# Patient Record
Sex: Female | Born: 1965
Health system: Southern US, Community
[De-identification: ages and names within clinical notes are randomized; demographics above are authoritative.]

## PROBLEM LIST (undated history)

## (undated) DIAGNOSIS — Z789 Other specified health status: Secondary | ICD-10-CM

## (undated) HISTORY — PX: NO PAST SURGERIES: SHX2092

---

## 2019-06-04 DIAGNOSIS — J3489 Other specified disorders of nose and nasal sinuses: Secondary | ICD-10-CM | POA: Diagnosis not present

## 2019-06-04 DIAGNOSIS — R0602 Shortness of breath: Secondary | ICD-10-CM | POA: Diagnosis not present

## 2019-06-04 DIAGNOSIS — M25512 Pain in left shoulder: Secondary | ICD-10-CM | POA: Diagnosis not present

## 2019-06-04 DIAGNOSIS — R05 Cough: Secondary | ICD-10-CM | POA: Diagnosis not present

## 2019-06-08 ENCOUNTER — Other Ambulatory Visit: Payer: Self-pay

## 2019-06-10 ENCOUNTER — Ambulatory Visit (INDEPENDENT_AMBULATORY_CARE_PROVIDER_SITE_OTHER): Payer: BC Managed Care – PPO | Admitting: Family Medicine

## 2019-06-10 ENCOUNTER — Encounter: Payer: Self-pay | Admitting: Family Medicine

## 2019-06-10 VITALS — BP 130/74 | HR 85 | Temp 98.7°F | Ht 66.0 in | Wt 206.6 lb

## 2019-06-10 DIAGNOSIS — M25511 Pain in right shoulder: Secondary | ICD-10-CM

## 2019-06-10 DIAGNOSIS — Z13228 Encounter for screening for other metabolic disorders: Secondary | ICD-10-CM

## 2019-06-10 DIAGNOSIS — Z1159 Encounter for screening for other viral diseases: Secondary | ICD-10-CM

## 2019-06-10 DIAGNOSIS — Z23 Encounter for immunization: Secondary | ICD-10-CM

## 2019-06-10 DIAGNOSIS — R635 Abnormal weight gain: Secondary | ICD-10-CM | POA: Diagnosis not present

## 2019-06-10 DIAGNOSIS — F322 Major depressive disorder, single episode, severe without psychotic features: Secondary | ICD-10-CM

## 2019-06-10 DIAGNOSIS — E782 Mixed hyperlipidemia: Secondary | ICD-10-CM

## 2019-06-10 DIAGNOSIS — Z130 Encounter for screening for diseases of the blood and blood-forming organs and certain disorders involving the immune mechanism: Secondary | ICD-10-CM

## 2019-06-10 DIAGNOSIS — Z13220 Encounter for screening for lipoid disorders: Secondary | ICD-10-CM

## 2019-06-10 DIAGNOSIS — Z1211 Encounter for screening for malignant neoplasm of colon: Secondary | ICD-10-CM

## 2019-06-10 DIAGNOSIS — Z8349 Family history of other endocrine, nutritional and metabolic diseases: Secondary | ICD-10-CM

## 2019-06-10 HISTORY — DX: Mixed hyperlipidemia: E78.2

## 2019-06-10 MED ORDER — CITALOPRAM HYDROBROMIDE 20 MG PO TABS: 20.0000 mg | ORAL_TABLET | Freq: Every day | ORAL | 2 refills | Status: DC

## 2019-06-10 MED ORDER — SHINGRIX 50 MCG/0.5ML IM SUSR: 0.5000 mL | Freq: Once | INTRAMUSCULAR | 0 refills | Status: AC

## 2019-06-10 NOTE — Progress Notes (Signed)
New Patient Office Visit  Assessment & Plan:  1. Depression, major, single episode, severe ([place]) - Patient started on medication today for the first time to help with depression.  - Thyroid Panel With TSH - citalopram (CELEXA) 20 MG tablet; Take 1 tablet (20 mg total) by mouth daily.  Dispense: 30 tablet; Refill: 2  2. Acute pain of right shoulder - Patient to complete course of steroids.  Encouraged her to try taking ibuprofen 600 mg WITH Tylenol 500 mg every 6-8 hours as needed for pain, heating pad, and muscle rubs such as Biofreeze, [place] gel, and horse liniment.  Shoulder exercises provided for patient to complete daily; she did not want to go somewhere for physical therapy.   3. Weight gain - Thyroid Panel With TSH - Lipid panel  4. Family history of thyroid disease - Thyroid Panel With TSH  5. Screening for metabolic disorder - KDX83+JASN  6. Screening for lipid disorders - Lipid panel  7. Screening for deficiency anemia - CBC with Differential/Platelet  8. Encounter for hepatitis C screening test for low risk patient - Hepatitis C antibody  9. Colon cancer screening - Cologuard  10. Immunization due - SHINGRIX injection; Inject 0.5 mLs into the muscle once for 1 dose.  Dispense: 0.5 mL; Refill: 0   Follow-up: Return in about 6 weeks (around 07/22/2019) for depression, shoulder, pap.   Hendricks Limes, MSN, APRN, FNP-C [facility]  Subjective:  Patient ID: Jamie Jacobson, female    DOB: 03-Apr-1966  Age: 54 y.o. MRN: [id number]  Patient Care Team: Loman Brooklyn, FNP as PCP - General (Family Medicine)  CC:  Chief Complaint  Patient presents with  . New Patient (Initial Visit)    Urgent care follow up- shoulder pain  . Establish Care  . Depression    x 1 month     HPI Jamie Jacobson presents to establish care. Patient does not have a previous PCP from which she is transferring care as she has not been going to the doctor.    Depression: Patient has been experiencing depression recently. Reports she does not have a history of depression. She has never been on medications for depression.  Depression screen [facility] 2/9 06/10/2019  Decreased Interest 3  Down, Depressed, Hopeless 3  PHQ - 2 Score 6  Altered sleeping 3  Tired, decreased energy 3  Change in appetite 3  Feeling bad or failure about yourself  0  Trouble concentrating 3  Moving slowly or fidgety/restless 3  Suicidal thoughts 0  PHQ-9 Score 21  Difficult doing work/chores Somewhat difficult    Shoulder Pain: Patient complaints of left shoulder pain.  She did not experience any trauma or injury that she can recall. The pain is described as aching most of the time and burning with prolonged use.  The onset of the pain was gradual, starting several weeks ago.  The pain occurs continuously.  Location is lateral. No history of dislocation. Symptoms are aggravated by all activities. Symptoms are diminished by heat and Prednisone. Other medications tried include Ibuprofen 800 mg which was not effective.   Mild stiffness is reported. 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 She was seen at [facility] on 06/04/19 and she was advised to f/u with PCP due to shoulder pain; she was given a steroid taper at that time. She has on on her last day of Prednisone.    Patient also concerned about weight  gain and a significant family history of thyroid disease. She would like lab work today to see if she has thyroid disease as well.    Review of Systems  Constitutional: Negative for chills, fever, malaise/fatigue and weight loss.  HENT: Negative for congestion, ear discharge, ear pain, nosebleeds, sinus pain, sore throat and tinnitus.   Eyes: Negative for blurred vision, double vision, pain, discharge and redness.  Respiratory: Negative for cough, shortness of breath and wheezing.   Cardiovascular: Negative for chest pain, palpitations and leg  swelling.  Gastrointestinal: Negative for abdominal pain, constipation, diarrhea, heartburn, nausea and vomiting.  Genitourinary: Negative for dysuria, frequency and urgency.  Musculoskeletal: Positive for joint pain. Negative for myalgias.  Skin: Negative for rash.  Neurological: Negative for dizziness, seizures, weakness and headaches.  Psychiatric/Behavioral: Positive for depression. Negative for substance abuse and suicidal ideas. The patient is not nervous/anxious.     Current Outpatient Medications:  .  citalopram (CELEXA) 20 MG tablet, Take 1 tablet (20 mg total) by mouth daily., Disp: 30 tablet, Rfl: 2  No Known Allergies  History reviewed. No pertinent past medical history.  History reviewed. No pertinent surgical history.  Family History  Problem Relation Age of Onset  . Hypertension Mother   . Cancer Father        bone marrow  . Thyroid disease Sister   . Thyroid disease Brother   . Hypertension Maternal Grandmother   . Emphysema Paternal Grandmother   . Diabetes Paternal Grandfather   . Thyroid disease Brother     Social History   Socioeconomic History  . Marital status: Single    Spouse name: Not on file  . Number of children: Not on file  . Years of education: Not on file  . Highest education level: Not on file  Occupational History  . Not on file  Tobacco Use  . Smoking status: Former Smoker    Types: Cigarettes    Quit date: 06/09/2018    Years since quitting: 1.0  . Smokeless tobacco: Never Used  Substance and Sexual Activity  . Alcohol use: Yes    Comment: occ  . Drug use: Never  . Sexual activity: Not on file  Other Topics Concern  . Not on file  Social History Narrative  . Not on file   Social Determinants of Health   Financial Resource Strain:   . Difficulty of Paying Living Expenses: Not on file  Food Insecurity:   . Worried About Charity fundraiser in the Last Year: Not on file  . Ran Out of Food in the Last Year: Not on file    Transportation Needs:   . Lack of Transportation (Medical): Not on file  . Lack of Transportation (Non-Medical): Not on file  Physical Activity:   . Days of Exercise per Week: Not on file  . Minutes of Exercise per Session: Not on file  Stress:   . Feeling of Stress : Not on file  Social Connections:   . Frequency of Communication with Friends and Family: Not on file  . Frequency of Social Gatherings with Friends and Family: Not on file  . Attends Religious Services: Not on file  . Active Member of Clubs or Organizations: Not on file  . Attends Archivist Meetings: Not on file  . Marital Status: Not on file  Intimate Partner Violence:   . Fear of Current or Ex-Partner: Not on file  . Emotionally Abused: Not on file  . Physically Abused: Not  on file  . Sexually Abused: Not on file    Objective:   Today's Vitals: BP 130/74   Pulse 85   Temp 98.7 F (37.1 C) (Temporal)   Ht '5\' 6"'$  (1.676 m)   Wt 206 lb 9.6 oz (93.7 kg)   SpO2 96%   BMI 33.35 kg/m   Physical Exam Vitals reviewed.  Constitutional:      General: She is not in acute distress.    Appearance: Normal appearance. She is obese. She is not ill-appearing, toxic-appearing or diaphoretic.  HENT:     Head: Normocephalic and atraumatic.  Eyes:     General: No scleral icterus.       Right eye: No discharge.        Left eye: No discharge.     Conjunctiva/sclera: Conjunctivae normal.  Cardiovascular:     Rate and Rhythm: Normal rate and regular rhythm.     Heart sounds: Normal heart sounds. No murmur. No friction rub. No gallop.   Pulmonary:     Effort: Pulmonary effort is normal. No respiratory distress.     Breath sounds: Normal breath sounds. No stridor. No wheezing, rhonchi or rales.  Musculoskeletal:     Right shoulder: Tenderness (laterally) present. No swelling, deformity, effusion or laceration. Decreased range of motion (unable to abduct >90 degrees; decreased external rotation). Normal strength.  Normal pulse.     Cervical back: Normal range of motion.  Skin:    General: Skin is warm and dry.     Capillary Refill: Capillary refill takes less than 2 seconds.  Neurological:     General: No focal deficit present.     Mental Status: She is alert and oriented to person, place, and time. Mental status is at baseline.  Psychiatric:        Mood and Affect: Mood normal.        Behavior: Behavior normal.        Thought Content: Thought content normal.        Judgment: Judgment normal.

## 2019-06-10 NOTE — Patient Instructions (Signed)
Ibuprofen 600 mg WITH Tylenol 500 mg every 6-8 hours as needed for pain. Heat Muscle rubs - Biofreeze, [place] Gel, Horse Liniment   Shoulder Exercises Ask your health care provider which exercises are safe for you. Do exercises exactly as told by your health care provider and adjust them as directed. It is normal to feel mild stretching, pulling, tightness, or discomfort as you do these exercises. Stop right away if you feel sudden pain or your pain gets worse. Do not begin these exercises until told by your health care provider. Stretching exercises External rotation and abduction This exercise is sometimes called corner stretch. This exercise rotates your arm outward (external rotation) and moves your arm out from your body (abduction). 1. Stand in a doorway with one of your feet slightly in front of the other. This is called a staggered stance. If you cannot reach your forearms to the door frame, stand facing a corner of a room. 2. Choose one of the following positions as told by your health care provider: ? Place your hands and forearms on the door frame above your head. ? Place your hands and forearms on the door frame at the height of your head. ? Place your hands on the door frame at the height of your elbows. 3. Slowly move your weight onto your front foot until you feel a stretch across your chest and in the front of your shoulders. Keep your head and chest upright and keep your abdominal muscles tight. 4. Hold for __________ seconds. 5. To release the stretch, shift your weight to your back foot. Repeat __________ times. Complete this exercise __________ times a day. Extension, standing 1. Stand and hold a broomstick, a cane, or a similar object behind your back. ? Your hands should be a little wider than shoulder width apart. ? Your palms should face away from your back. 2. Keeping your elbows straight and your shoulder muscles relaxed, move the stick away from your body until you  feel a stretch in your shoulders (extension). ? Avoid shrugging your shoulders while you move the stick. Keep your shoulder blades tucked down toward the middle of your back. 3. Hold for __________ seconds. 4. Slowly return to the starting position. Repeat __________ times. Complete this exercise __________ times a day. Range-of-motion exercises Pendulum  1. Stand near a wall or a surface that you can hold onto for balance. 2. Bend at the waist and let your left / right arm hang straight down. Use your other arm to support you. Keep your back straight and do not lock your knees. 3. Relax your left / right arm and shoulder muscles, and move your hips and your trunk so your left / right arm swings freely. Your arm should swing because of the motion of your body, not because you are using your arm or shoulder muscles. 4. Keep moving your hips and trunk so your arm swings in the following directions, as told by your health care provider: ? Side to side. ? Forward and backward. ? In clockwise and counterclockwise circles. 5. Continue each motion for __________ seconds, or for as long as told by your health care provider. 6. Slowly return to the starting position. Repeat __________ times. Complete this exercise __________ times a day. Shoulder flexion, standing  1. Stand and hold a broomstick, a cane, or a similar object. Place your hands a little more than shoulder width apart on the object. Your left / right hand should be palm up, and your  other hand should be palm down. 2. Keep your elbow straight and your shoulder muscles relaxed. Push the stick up with your healthy arm to raise your left / right arm in front of your body, and then over your head until you feel a stretch in your shoulder (flexion). ? Avoid shrugging your shoulder while you raise your arm. Keep your shoulder blade tucked down toward the middle of your back. 3. Hold for __________ seconds. 4. Slowly return to the starting  position. Repeat __________ times. Complete this exercise __________ times a day. Shoulder abduction, standing 1. Stand and hold a broomstick, a cane, or a similar object. Place your hands a little more than shoulder width apart on the object. Your left / right hand should be palm up, and your other hand should be palm down. 2. Keep your elbow straight and your shoulder muscles relaxed. Push the object across your body toward your left / right side. Raise your left / right arm to the side of your body (abduction) until you feel a stretch in your shoulder. ? Do not raise your arm above shoulder height unless your health care provider tells you to do that. ? If directed, raise your arm over your head. ? Avoid shrugging your shoulder while you raise your arm. Keep your shoulder blade tucked down toward the middle of your back. 3. Hold for __________ seconds. 4. Slowly return to the starting position. Repeat __________ times. Complete this exercise __________ times a day. Internal rotation  1. Place your left / right hand behind your back, palm up. 2. Use your other hand to dangle an exercise band, a towel, or a similar object over your shoulder. Grasp the band with your left / right hand so you are holding on to both ends. 3. Gently pull up on the band until you feel a stretch in the front of your left / right shoulder. The movement of your arm toward the center of your body is called internal rotation. ? Avoid shrugging your shoulder while you raise your arm. Keep your shoulder blade tucked down toward the middle of your back. 4. Hold for __________ seconds. 5. Release the stretch by letting go of the band and lowering your hands. Repeat __________ times. Complete this exercise __________ times a day. Strengthening exercises External rotation  1. Sit in a stable chair without armrests. 2. Secure an exercise band to a stable object at elbow height on your left / right side. 3. Place a soft  object, such as a folded towel or a small pillow, between your left / right upper arm and your body to move your elbow about 4 inches (10 cm) away from your side. 4. Hold the end of the exercise band so it is tight and there is no slack. 5. Keeping your elbow pressed against the soft object, slowly move your forearm out, away from your abdomen (external rotation). Keep your body steady so only your forearm moves. 6. Hold for __________ seconds. 7. Slowly return to the starting position. Repeat __________ times. Complete this exercise __________ times a day. Shoulder abduction  1. Sit in a stable chair without armrests, or stand up. 2. Hold a __________ weight in your left / right hand, or hold an exercise band with both hands. 3. Start with your arms straight down and your left / right palm facing in, toward your body. 4. Slowly lift your left / right hand out to your side (abduction). Do not lift your hand above  shoulder height unless your health care provider tells you that this is safe. ? Keep your arms straight. ? Avoid shrugging your shoulder while you do this movement. Keep your shoulder blade tucked down toward the middle of your back. 5. Hold for __________ seconds. 6. Slowly lower your arm, and return to the starting position. Repeat __________ times. Complete this exercise __________ times a day. Shoulder extension 1. Sit in a stable chair without armrests, or stand up. 2. Secure an exercise band to a stable object in front of you so it is at shoulder height. 3. Hold one end of the exercise band in each hand. Your palms should face each other. 4. Straighten your elbows and lift your hands up to shoulder height. 5. Step back, away from the secured end of the exercise band, until the band is tight and there is no slack. 6. Squeeze your shoulder blades together as you pull your hands down to the sides of your thighs (extension). Stop when your hands are straight down by your sides. Do  not let your hands go behind your body. 7. Hold for __________ seconds. 8. Slowly return to the starting position. Repeat __________ times. Complete this exercise __________ times a day. Shoulder row 1. Sit in a stable chair without armrests, or stand up. 2. Secure an exercise band to a stable object in front of you so it is at waist height. 3. Hold one end of the exercise band in each hand. Position your palms so that your thumbs are facing the ceiling (neutral position). 4. Bend each of your elbows to a 90-degree angle (right angle) and keep your upper arms at your sides. 5. Step back until the band is tight and there is no slack. 6. Slowly pull your elbows back behind you. 7. Hold for __________ seconds. 8. Slowly return to the starting position. Repeat __________ times. Complete this exercise __________ times a day. Shoulder press-ups  1. Sit in a stable chair that has armrests. Sit upright, with your feet flat on the floor. 2. Put your hands on the armrests so your elbows are bent and your fingers are pointing forward. Your hands should be about even with the sides of your body. 3. Push down on the armrests and use your arms to lift yourself off the chair. Straighten your elbows and lift yourself up as much as you comfortably can. ? Move your shoulder blades down, and avoid letting your shoulders move up toward your ears. ? Keep your feet on the ground. As you get stronger, your feet should support less of your body weight as you lift yourself up. 4. Hold for __________ seconds. 5. Slowly lower yourself back into the chair. Repeat __________ times. Complete this exercise __________ times a day. Wall push-ups  1. Stand so you are facing a stable wall. Your feet should be about one arm-length away from the wall. 2. Lean forward and place your palms on the wall at shoulder height. 3. Keep your feet flat on the floor as you bend your elbows and lean forward toward the wall. 4. Hold for  __________ seconds. 5. Straighten your elbows to push yourself back to the starting position. Repeat __________ times. Complete this exercise __________ times a day. This information is not intended to replace advice given to you by your health care provider. Make sure you discuss any questions you have with your health care provider. Document Revised: 08/27/2018 Document Reviewed: 06/04/2018 Elsevier Patient Education  2020 [employer].

## 2019-06-11 LAB — CBC WITH DIFFERENTIAL/PLATELET
Basophils Absolute: 0.1 10*3/uL (ref 0.0–0.2)
Basos: 1 %
EOS (ABSOLUTE): 0.4 10*3/uL (ref 0.0–0.4)
Eos: 3 %
Hematocrit: 42 % (ref 34.0–46.6)
Hemoglobin: 13.7 g/dL (ref 11.1–15.9)
Immature Grans (Abs): 0.1 10*3/uL (ref 0.0–0.1)
Immature Granulocytes: 1 %
Lymphocytes Absolute: 5.2 10*3/uL — ABNORMAL HIGH (ref 0.7–3.1)
Lymphs: 39 %
MCH: 28.5 pg (ref 26.6–33.0)
MCHC: 32.6 g/dL (ref 31.5–35.7)
MCV: 87 fL (ref 79–97)
Monocytes Absolute: 1 10*3/uL — ABNORMAL HIGH (ref 0.1–0.9)
Monocytes: 8 %
Neutrophils Absolute: 6.5 10*3/uL (ref 1.4–7.0)
Neutrophils: 48 %
Platelets: 417 10*3/uL (ref 150–450)
RBC: 4.81 x10E6/uL (ref 3.77–5.28)
RDW: 12.8 % (ref 11.7–15.4)
WBC: 13.3 10*3/uL — ABNORMAL HIGH (ref 3.4–10.8)

## 2019-06-11 LAB — LIPID PANEL
Chol/HDL Ratio: 4.3 ratio (ref 0.0–4.4)
Cholesterol, Total: 230 mg/dL — ABNORMAL HIGH (ref 100–199)
HDL: 54 mg/dL (ref >39)
LDL Chol Calc (NIH): 140 mg/dL — ABNORMAL HIGH (ref 0–99)
Triglycerides: 204 mg/dL — ABNORMAL HIGH (ref 0–149)
VLDL Cholesterol Cal: 36 mg/dL (ref 5–40)

## 2019-06-11 LAB — HEPATITIS C ANTIBODY: Hep C Virus Ab: <0.1 s/co ratio (ref 0.0–0.9)

## 2019-06-11 LAB — CMP14+EGFR
ALT: 27 IU/L (ref 0–32)
AST: 28 IU/L (ref 0–40)
Albumin/Globulin Ratio: 1.5 (ref 1.2–2.2)
Albumin: 4.5 g/dL (ref 3.8–4.9)
Alkaline Phosphatase: 96 IU/L (ref 39–117)
BUN/Creatinine Ratio: 36 — ABNORMAL HIGH (ref 9–23)
BUN: 32 mg/dL — ABNORMAL HIGH (ref 6–24)
Bilirubin Total: 0.3 mg/dL (ref 0.0–1.2)
CO2: 28 mmol/L (ref 20–29)
Calcium: 9.5 mg/dL (ref 8.7–10.2)
Chloride: 98 mmol/L (ref 96–106)
Creatinine, Ser: 0.88 mg/dL (ref 0.57–1.00)
GFR calc Af Amer: 85 mL/min/{1.73_m2} (ref >59)
GFR calc non Af Amer: 74 mL/min/{1.73_m2} (ref >59)
Globulin, Total: 3 g/dL (ref 1.5–4.5)
Glucose: 88 mg/dL (ref 65–99)
Potassium: 4.8 mmol/L (ref 3.5–5.2)
Sodium: 139 mmol/L (ref 134–144)
Total Protein: 7.5 g/dL (ref 6.0–8.5)

## 2019-06-11 LAB — THYROID PANEL WITH TSH
Free Thyroxine Index: 1.8 (ref 1.2–4.9)
T3 Uptake Ratio: 25 % (ref 24–39)
T4, Total: 7.1 ug/dL (ref 4.5–12.0)
TSH: 2.05 u[IU]/mL (ref 0.450–4.500)

## 2019-06-12 ENCOUNTER — Encounter: Payer: Self-pay | Admitting: Family Medicine

## 2019-06-12 DIAGNOSIS — E782 Mixed hyperlipidemia: Secondary | ICD-10-CM | POA: Insufficient documentation

## 2019-07-19 DIAGNOSIS — Z1211 Encounter for screening for malignant neoplasm of colon: Secondary | ICD-10-CM | POA: Diagnosis not present

## 2019-07-22 ENCOUNTER — Encounter: Payer: BC Managed Care – PPO | Admitting: Family Medicine

## 2019-07-26 LAB — COLOGUARD: Cologuard: NEGATIVE

## 2019-08-01 ENCOUNTER — Telehealth: Payer: Self-pay | Admitting: Family Medicine

## 2019-08-01 NOTE — Telephone Encounter (Signed)
Aware.  Negative cologuard result.

## 2019-08-30 ENCOUNTER — Encounter: Payer: BC Managed Care – PPO | Admitting: Family Medicine

## 2019-09-08 ENCOUNTER — Encounter: Payer: Self-pay | Admitting: Family Medicine

## 2020-07-23 ENCOUNTER — Telehealth: Payer: Self-pay

## 2020-12-27 ENCOUNTER — Ambulatory Visit: Payer: Self-pay

## 2020-12-27 ENCOUNTER — Telehealth: Payer: Self-pay

## 2020-12-27 ENCOUNTER — Other Ambulatory Visit: Payer: Self-pay

## 2020-12-27 ENCOUNTER — Ambulatory Visit (INDEPENDENT_AMBULATORY_CARE_PROVIDER_SITE_OTHER): Payer: BC Managed Care – PPO | Admitting: Orthopaedic Surgery

## 2020-12-27 DIAGNOSIS — R200 Anesthesia of skin: Secondary | ICD-10-CM | POA: Diagnosis not present

## 2020-12-27 DIAGNOSIS — M5136 Other intervertebral disc degeneration, lumbar region: Secondary | ICD-10-CM | POA: Diagnosis not present

## 2020-12-27 DIAGNOSIS — M542 Cervicalgia: Secondary | ICD-10-CM

## 2020-12-27 DIAGNOSIS — M4712 Other spondylosis with myelopathy, cervical region: Secondary | ICD-10-CM | POA: Diagnosis not present

## 2020-12-27 NOTE — Telephone Encounter (Signed)
Patient seen today-referred for cervical and lumbar MRI's. Asking for valium for scans. Please advise.

## 2020-12-27 NOTE — Progress Notes (Signed)
Office Visit Note   Patient: Jamie Jacobson           Date of Birth: [birth year]           MRN: [id number] Visit Date: 12/27/2020              Requested by: Gwenlyn Fudge, FNP [address] [place],  [place] [address] PCP: Gwenlyn Fudge, FNP   Assessment & Plan: Visit Diagnoses:  1. Neck pain   2. Bilateral leg numbness   3. Other spondylosis with myelopathy, cervical region     Plan: Work slip given no work x2 weeks.  She is having trouble walking has sensory deficit motor deficit balance problems.  This is most likely cervical in origin but she may have a combination of cervical as well as lumbar stenosis.  Patient needs an MRI scan both cervical and lumbar spine and office follow-up after scans.  Patient has some claustrophobia Valium sent in.  Follow-Up Instructions: No follow-ups on file.   Orders:  Orders Placed This Encounter  Procedures   XR Cervical Spine 2 or 3 views   XR Lumbar Spine 2-3 Views   MR Cervical Spine w/o contrast   MR Lumbar Spine w/o contrast   No orders of the defined types were placed in this encounter.     Procedures: No procedures performed   Clinical Data: No additional findings.   Subjective: Chief Complaint  Patient presents with   Other     Acute onset of back pain x 1.5 months.  Now having bilateral leg numbness, neck pain, balance issues, incontinence    HPI 55 year old female works at [employer] saw the nurse at work was having problems with balance wide-based gait.  She states she feels like she is numb from the waist down and walks like a drunk.  She has trouble getting up and down stairs.  She has had balance issues but has not fallen.  Had problems holding her urine at times and has some problems with severe constipation for the last week.  No diabetes no hypertension non-smoker.  Patient has noted numbness and tingling in her fingers and not noticed weakness in her arms.  She states from the waist down she has  numbness.  Review of Systems patient has mixed hyperlipidemia take some Celexa otherwise has been active healthy without significant health problems.   Objective: Vital Signs: There were no vitals taken for this visit.  Physical Exam Constitutional:      Appearance: She is well-developed.  HENT:     Head: Normocephalic.     Right Ear: External ear normal.     Left Ear: External ear normal. There is no impacted cerumen.  Eyes:     Pupils: Pupils are equal, round, and reactive to light.  Neck:     Thyroid: No thyromegaly.     Trachea: No tracheal deviation.  Cardiovascular:     Rate and Rhythm: Normal rate.  Pulmonary:     Effort: Pulmonary effort is normal.  Abdominal:     Palpations: Abdomen is soft.  Musculoskeletal:     Cervical back: No rigidity.  Skin:    General: Skin is warm and dry.  Neurological:     Mental Status: She is alert and oriented to person, place, and time.  Psychiatric:        Behavior: Behavior normal.    Ortho Exam patient has minimal little pain with brachial plexus compression negative Spurling.  Negative Lhermitte.  Lower  extremity reflexes are 2+ and symmetrical she is able to heel and toe walk.  Negative straight leg raising 90 degrees.  She complains of symmetrical decreased sensation from the waist down all dermatomes.  Slight wide-based gait some decreased balance with turning changing directions.  Specialty Comments:  No specialty comments available.  Imaging: XR Cervical Spine 2 or 3 views  Result Date: 12/28/2020 AP lateral cervical spine x-ray showed multilevel spondylitic changes with narrowing at C3-4 C4-5 C5-6.  Spurring more prominent at C3-4 and C5-6.  Uncovertebral changes noted on AP x-ray. Impression: Multilevel cervical spondylosis.  Straightening of cervical spine.  More spurring and narrowing at C3-4 and C5-6.  XR Lumbar Spine 2-3 Views  Result Date: 12/28/2020 AP lateral lumbar spine x-rays are obtained and reviewed.  This  shows disc space narrowing at L4-5, anterior spurring at L3-4.  Normal lumbar curvature.  Normal SI joints. Impression: Changes consistent with lumbar disc degeneration without acute fracture.    PMFS History: Patient Active Problem List   Diagnosis Date Noted   Other spondylosis with myelopathy, cervical region 12/28/2020   Mixed hyperlipidemia    Past Medical History:  Diagnosis Date   Mixed hyperlipidemia 06/10/2019    Family History  Problem Relation Age of Onset   Hypertension Mother    Cancer Father        bone marrow   Thyroid disease Sister    Thyroid disease Brother    Hypertension Maternal Grandmother    Emphysema Paternal Grandmother    Diabetes Paternal Grandfather    Thyroid disease Brother     No past surgical history on file. Social History   Occupational History   Not on file  Tobacco Use   Smoking status: Former    Types: Cigarettes    Quit date: 06/09/2018    Years since quitting: 2.5   Smokeless tobacco: Never  Vaping Use   Vaping Use: Never used  Substance and Sexual Activity   Alcohol use: Yes    Comment: occ   Drug use: Never   Sexual activity: Not on file

## 2020-12-28 ENCOUNTER — Telehealth: Payer: Self-pay

## 2020-12-28 DIAGNOSIS — M4712 Other spondylosis with myelopathy, cervical region: Secondary | ICD-10-CM | POA: Insufficient documentation

## 2020-12-28 DIAGNOSIS — M5136 Other intervertebral disc degeneration, lumbar region: Secondary | ICD-10-CM | POA: Insufficient documentation

## 2020-12-28 MED ORDER — DIAZEPAM 5 MG PO TABS: ORAL_TABLET | ORAL | 0 refills | Status: DC

## 2020-12-28 NOTE — Addendum Note (Signed)
Addended byPrescott Parma on: 12/28/2020 01:43 PM   Modules accepted: Orders

## 2020-12-28 NOTE — Telephone Encounter (Signed)
I called to pharmacy. Also patient wanted work note to return to work. She stated her job advised her if she did not report to work on Sunday she would be fired. I have ok'd this by Dr Ophelia Charter. Note written.

## 2020-12-28 NOTE — Telephone Encounter (Signed)
error 

## 2021-01-01 ENCOUNTER — Other Ambulatory Visit: Payer: Self-pay

## 2021-01-01 ENCOUNTER — Telehealth: Payer: Self-pay | Admitting: Orthopaedic Surgery

## 2021-01-01 ENCOUNTER — Ambulatory Visit
Admission: RE | Admit: 2021-01-01 | Discharge: 2021-01-01 | Disposition: A | Discharge status: Home / Self Care | Payer: BC Managed Care – PPO | Source: Ambulatory Visit | Attending: Orthopaedic Surgery | Admitting: Orthopaedic Surgery

## 2021-01-01 ENCOUNTER — Telehealth: Payer: Self-pay | Admitting: Radiology

## 2021-01-01 DIAGNOSIS — M4802 Spinal stenosis, cervical region: Secondary | ICD-10-CM | POA: Diagnosis not present

## 2021-01-01 DIAGNOSIS — M542 Cervicalgia: Secondary | ICD-10-CM | POA: Diagnosis not present

## 2021-01-01 DIAGNOSIS — M48061 Spinal stenosis, lumbar region without neurogenic claudication: Secondary | ICD-10-CM | POA: Diagnosis not present

## 2021-01-01 DIAGNOSIS — R200 Anesthesia of skin: Secondary | ICD-10-CM

## 2021-01-01 IMAGING — MR MR CERVICAL SPINE W/O CM
4 of 5 series · 28 of 48 positions shown · non-contrast
Comparison: None.

CLINICAL DATA: Neck pain with bilateral shoulder and arm pain for 2
months

EXAM:
MRI CERVICAL SPINE WITHOUT CONTRAST
TECHNIQUE: Multiplanar, multisequence MR imaging of the cervical spine was
performed. No intravenous contrast was administered.

[Series 5: T2 · sagittal · 3.0mm · 0.55mm/px · 7 of 15 slices shown (1 of 2)]
[im 1/15]
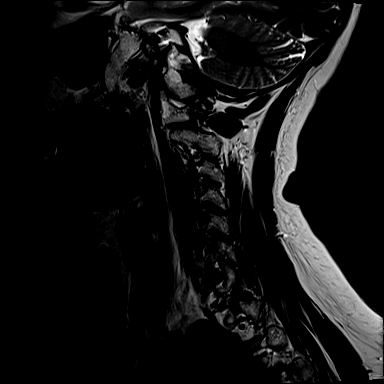
[im 3/15]
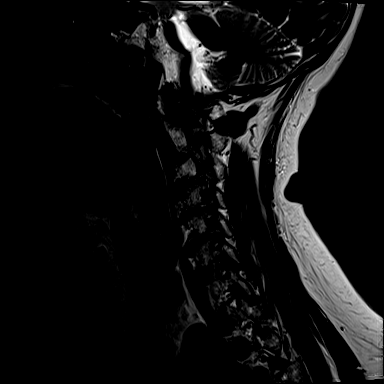
[im 5/15]
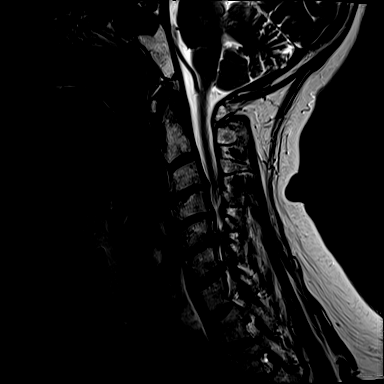
[im 8/15]
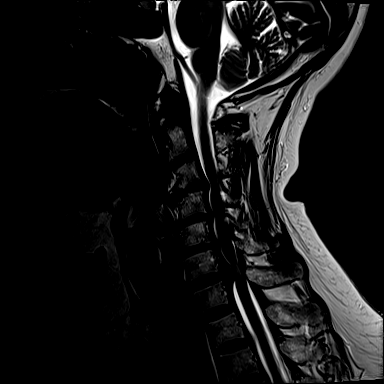
[im 10/15]
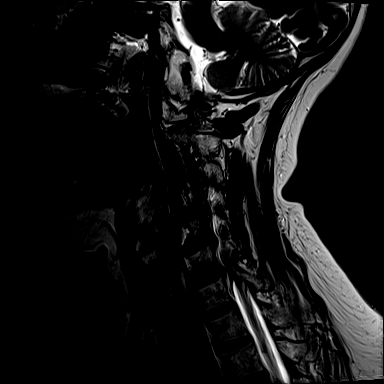
[im 12/15]
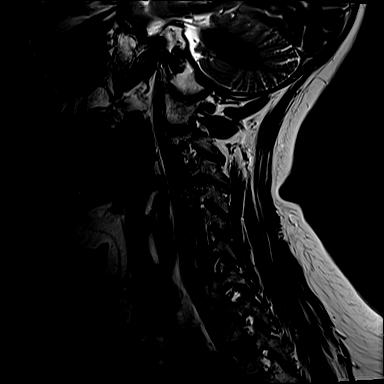
[im 15/15]
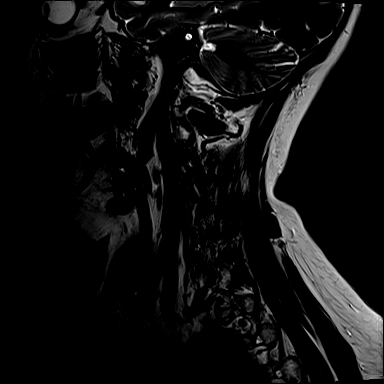

[Series 6: T1 · sagittal · 3.0mm · 0.66mm/px · 7 of 15 slices shown]
[im 1/15]
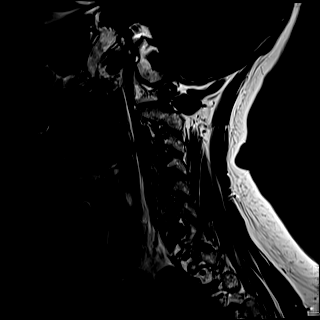
[im 3/15]
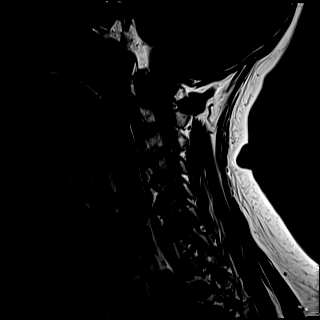
[im 5/15]
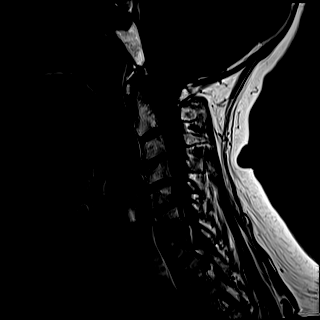
[im 8/15]
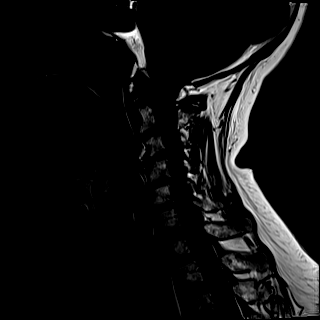
[im 10/15]
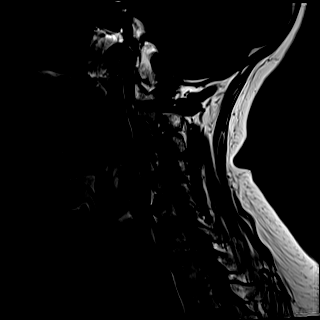
[im 12/15]
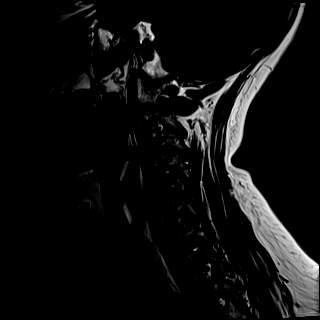
[im 15/15]
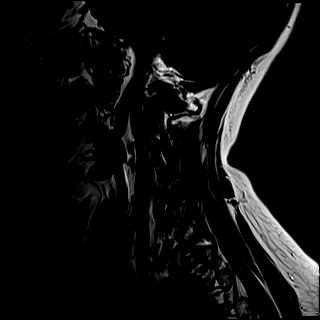

[Series 7: STIR · sagittal · 3.0mm · 0.33mm/px · 6 of 15 slices shown]
[im 1/15]
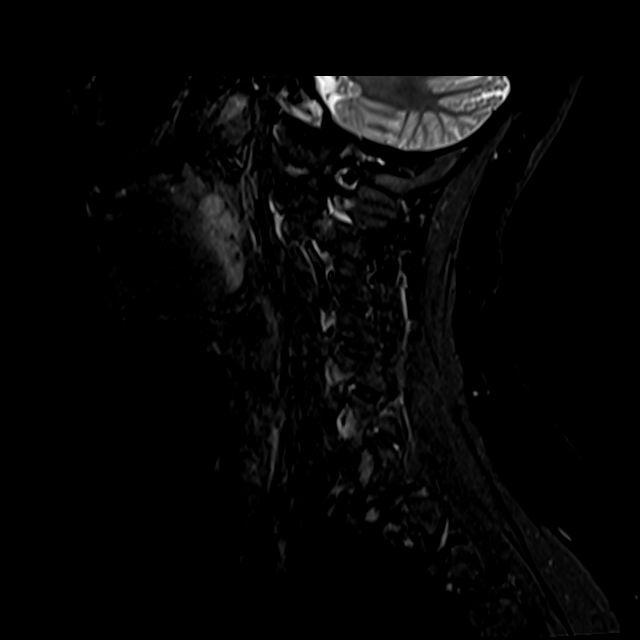
[im 3/15]
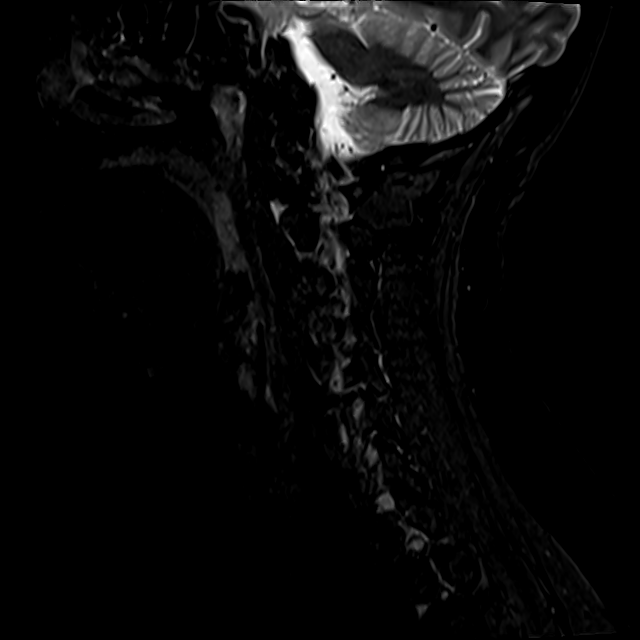
[im 6/15]
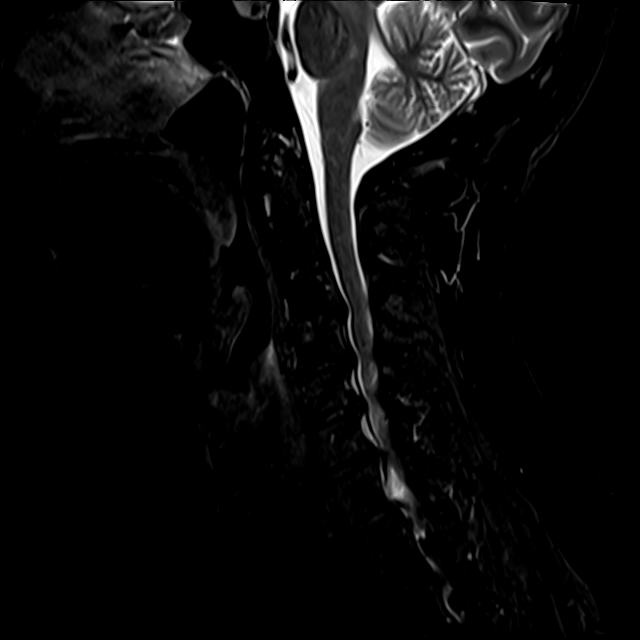
[im 9/15]
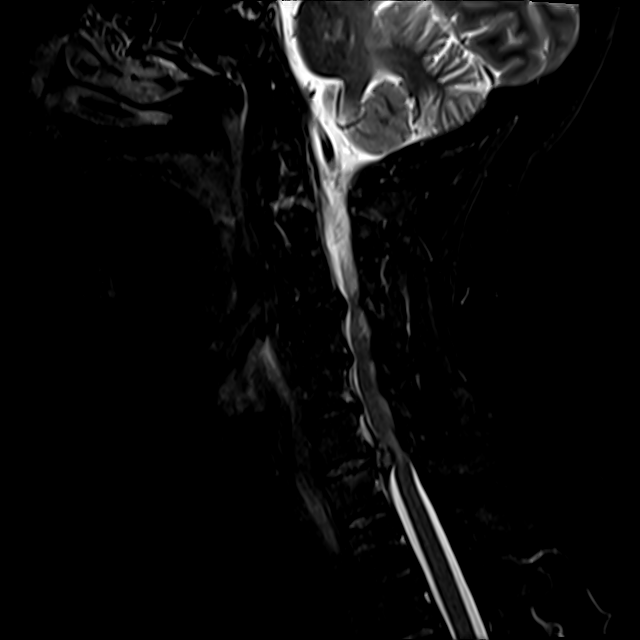
[im 12/15]
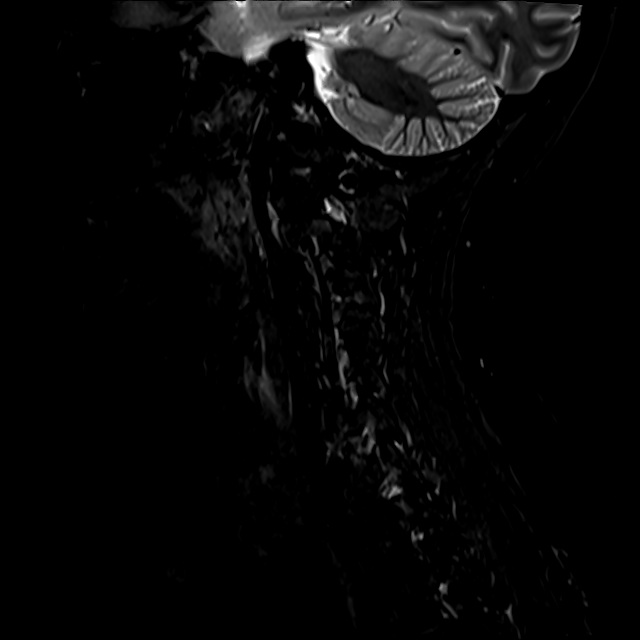
[im 15/15]
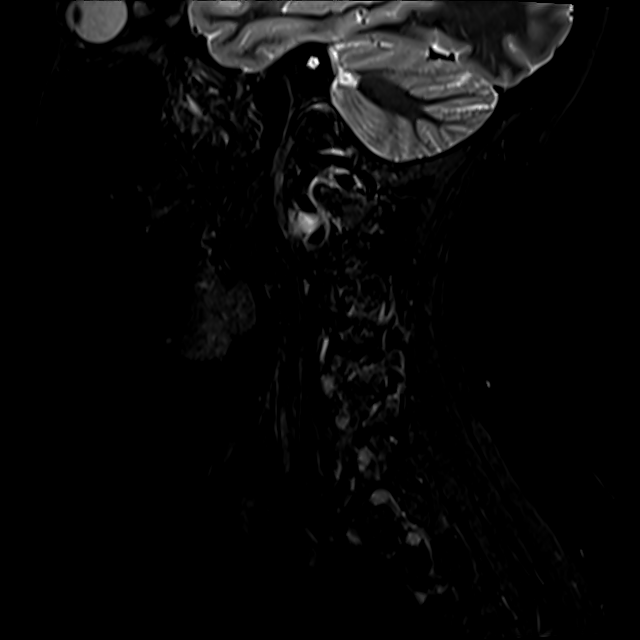

[Series 8: T2 · axial · 3.0mm · 0.50mm/px · z∈[-46,+56]mm · 8 of 33 slices shown (2 of 2)]
[im 1/33]
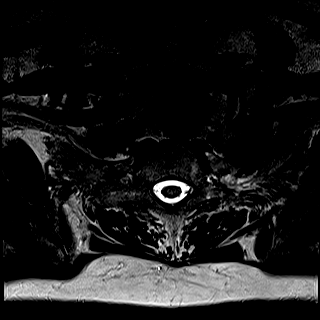
[im 5/33]
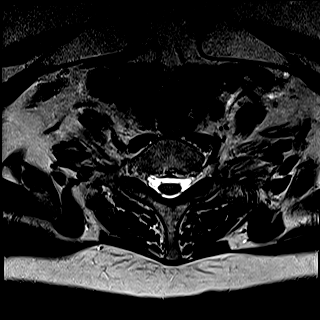
[im 10/33]
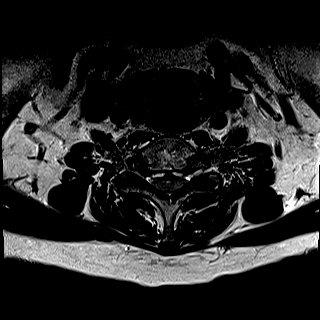
[im 15/33]
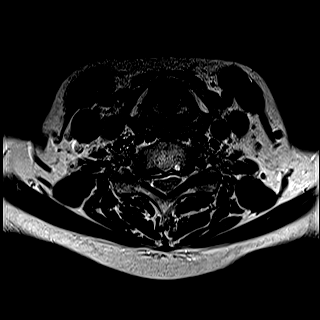
[im 18/33]
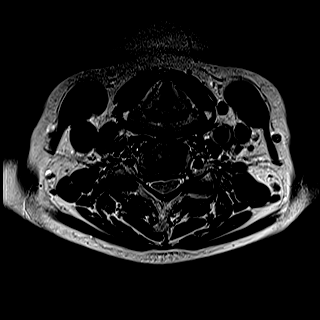
[im 23/33]
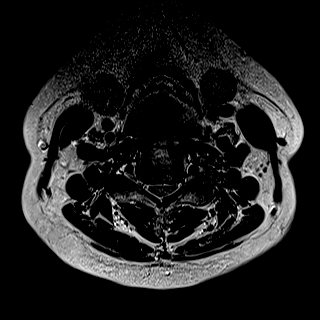
[im 28/33]
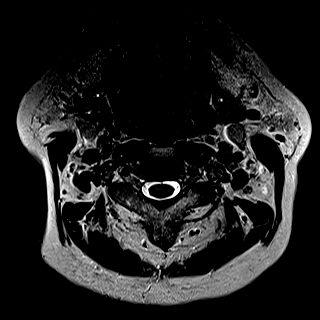
[im 33/33]
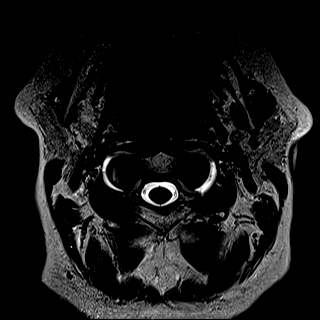

[28 of 48 positions shown; findings below may reference images not displayed]

FINDINGS: Alignment: Degenerative reversal of cervical lordosis with mild C2-3
anterolisthesis.

Vertebrae: No fracture, evidence of discitis, or bone lesion.

Cord: T2 hyperintensity in the compressed cord at the level of C6-7.

Posterior Fossa, vertebral arteries, paraspinal tissues: No evidence
of perispinal mass or inflammation. 7 mm right thyroid nodule. No
followup recommended (ref: [HOSPITAL]. [DATE]):

Disc levels:

C2-3: Unremarkable.

C3-4: Disc narrowing with bulging. Endplate and uncovertebral
ridging with biforaminal impingement. Spinal stenosis with ventral
cord indentation.

C4-5: Disc narrowing and bulging with uncovertebral ridging
bilaterally. Spinal stenosis with ventral cord flattening.
Biforaminal impingement

C5-6: Disc narrowing and bulging with bulky right eccentric ridging.
Spinal stenosis with right cord flattening. Right more than left
foraminal impingement

C6-7: Large disc herniation with cord compression and T2
hyperintensity. Right foraminal protrusion and impingement.

C7-T1:Unremarkable.

These results will be called to the ordering clinician or
representative by the Radiologist Assistant, and communication
documented in the PACS or [REDACTED].
IMPRESSION: 1. C6-7 disc extrusion with cord compression and edema.
2. Chronic degenerative spinal stenosis at C3-4 to C5-6 with ventral
cord flattening.
3. Foraminal impingement at C3-4 to C6-7, as above.

## 2021-01-01 IMAGING — MR MR LUMBAR SPINE W/O CM
4 of 5 series · 18 of 48 positions shown · non-contrast
Comparison: None.

CLINICAL DATA: open MRI lumbar spine eval for lumbar
stenosis-falling with bilateral lower extremity numbness

EXAM:
MRI LUMBAR SPINE WITHOUT CONTRAST
TECHNIQUE: Multiplanar, multisequence MR imaging of the lumbar spine was
performed. No intravenous contrast was administered.

[Series 5: T2 · sagittal · 4.0mm · 0.73mm/px · 6 of 15 slices shown (1 of 2)]
[im 1/15]
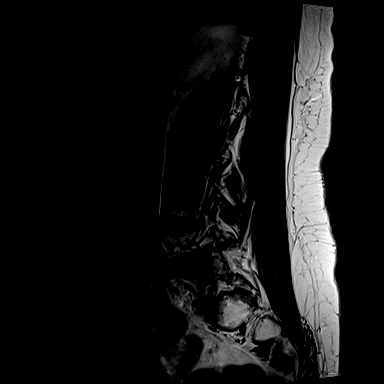
[im 3/15]
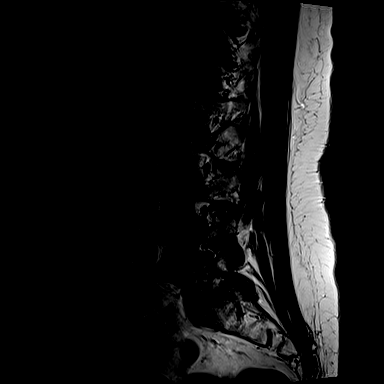
[im 6/15]
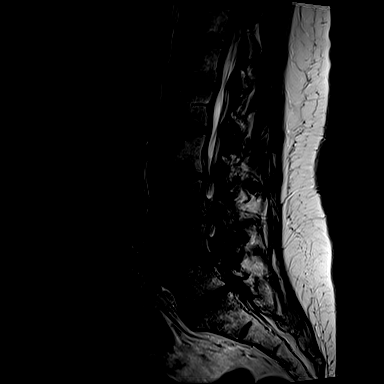
[im 9/15]
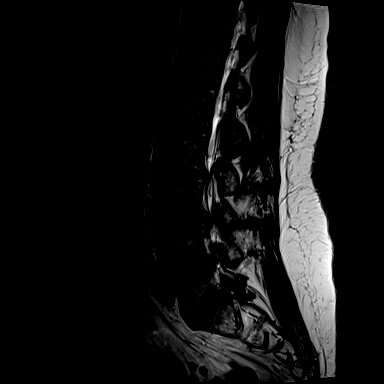
[im 12/15]
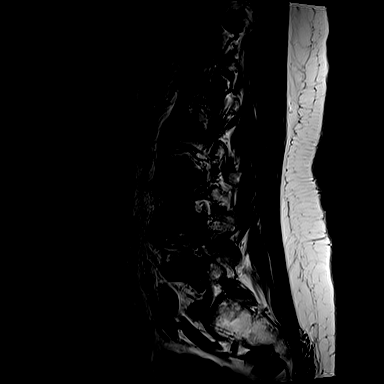
[im 15/15]
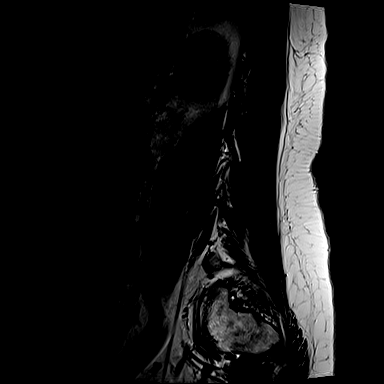

[Series 6: T1 · sagittal · 4.0mm · 0.73mm/px · 3 of 15 slices shown (1 of 2)]
[im 3/15]
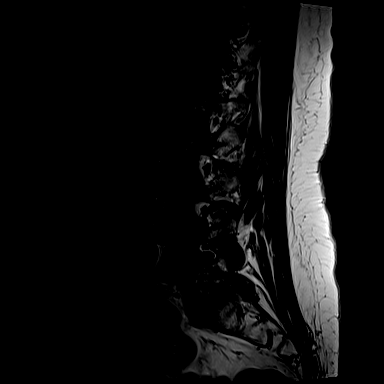
[im 9/15]
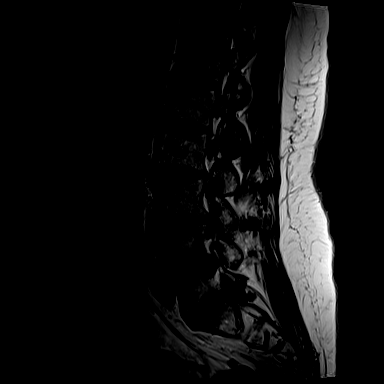
[im 15/15]
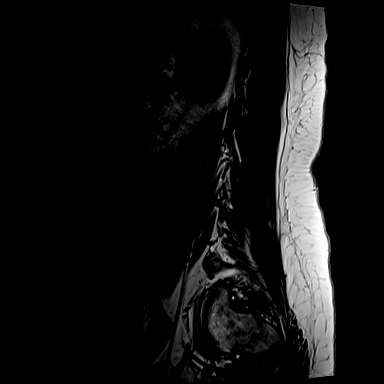

[Series 10: T1 · axial · 4.0mm · 0.28mm/px · z∈[-85,+76]mm · 3 of 39 slices shown (2 of 2)]
[im 6/39]
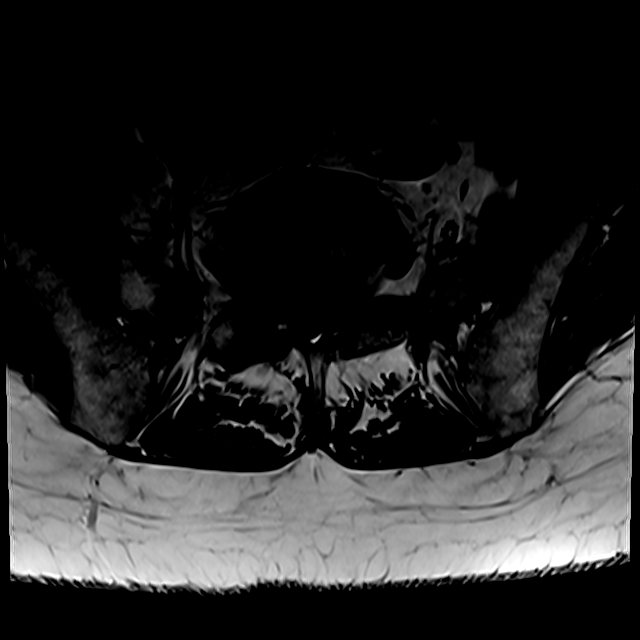
[im 20/39]
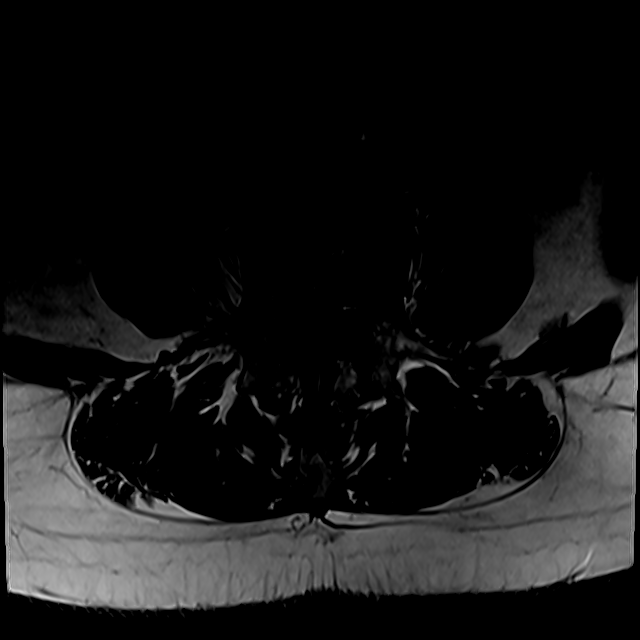
[im 33/39]
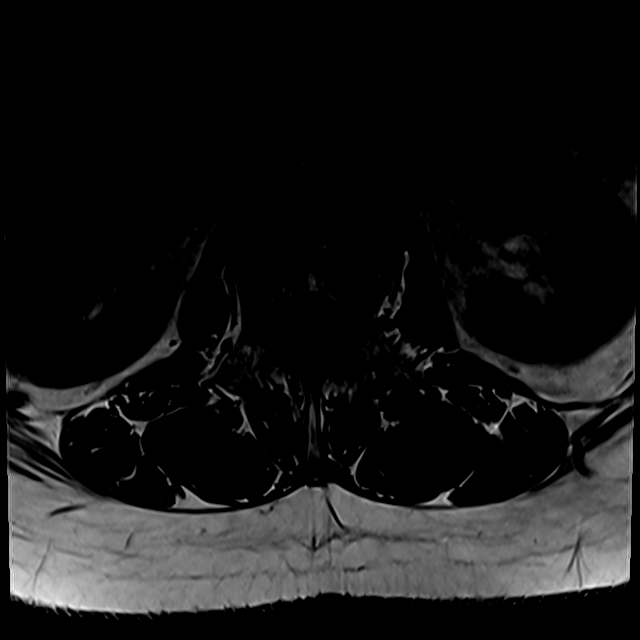

[Series 13: T2 · axial · 4.0mm · 0.28mm/px · z∈[-109,+76]mm · 6 of 39 slices shown (2 of 2)]
[im 1/39]
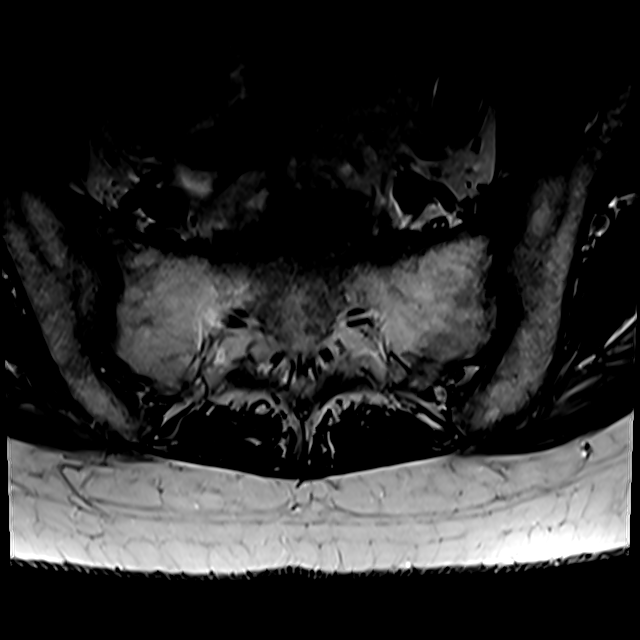
[im 6/39]
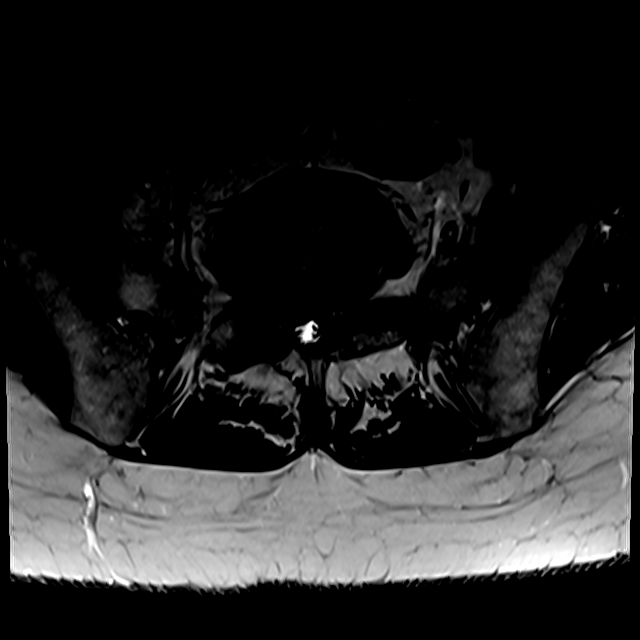
[im 11/39]
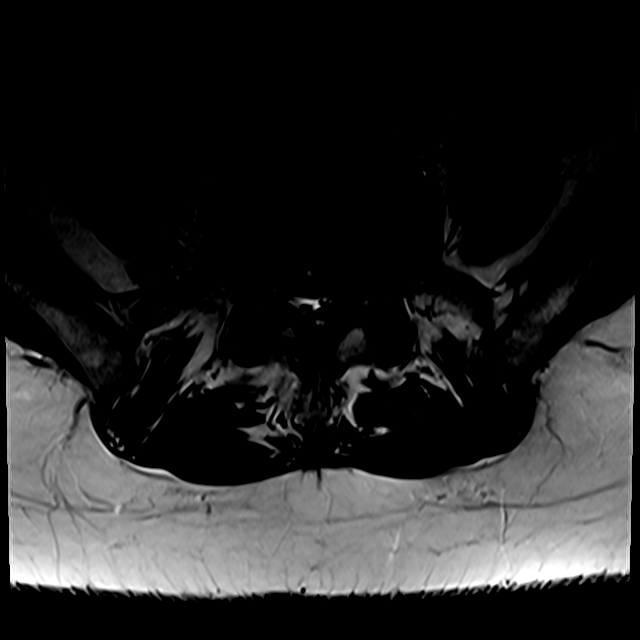
[im 17/39]
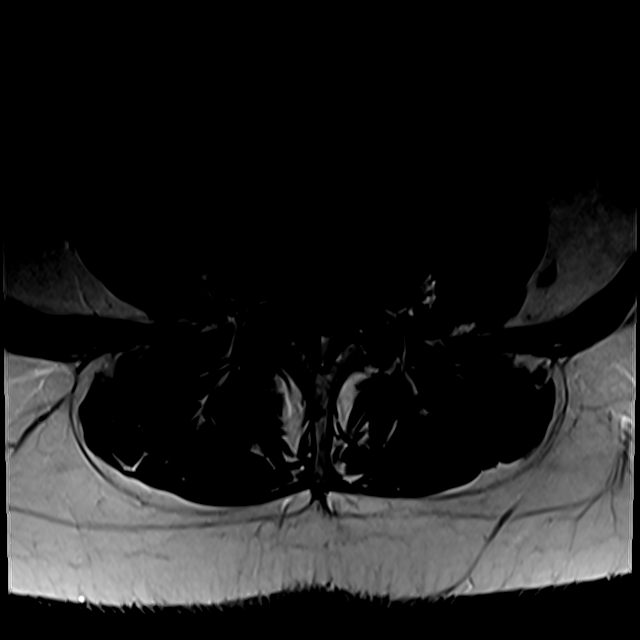
[im 20/39]
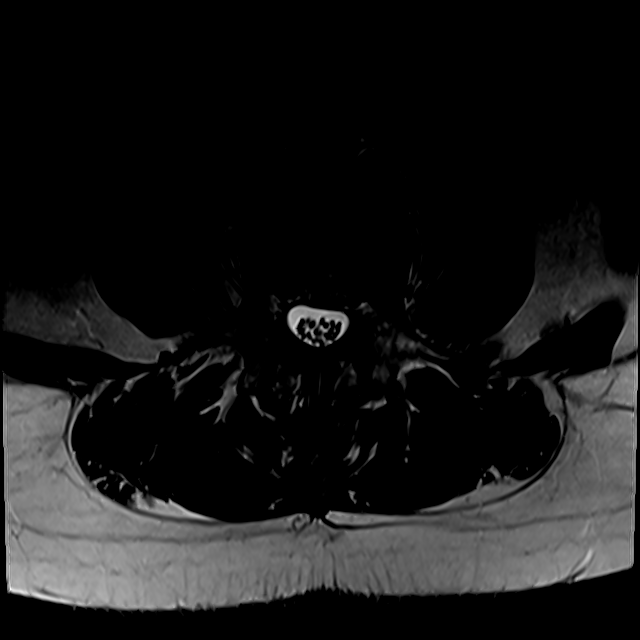
[im 33/39]
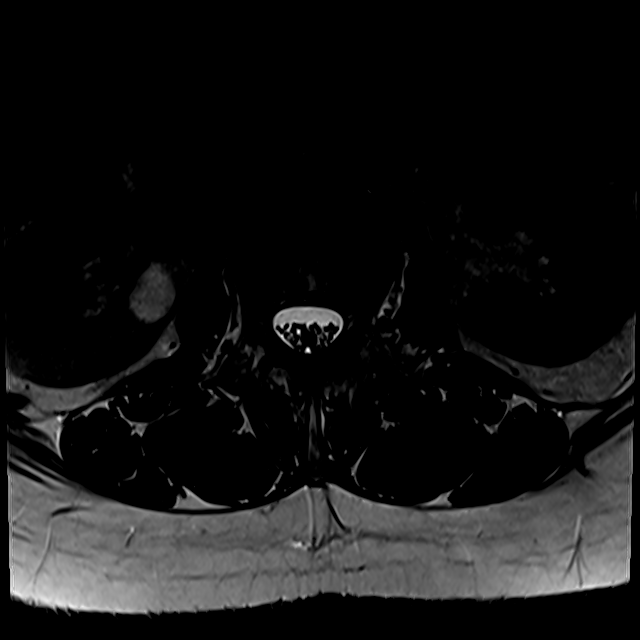

[18 of 48 positions shown; findings below may reference images not displayed]

FINDINGS: Segmentation: Presumed standard anatomy with the inferior-most well
developed disc space designated as L5-S1.

Alignment:  Physiologic.

Vertebrae: No fracture, evidence of discitis, or bone lesion.
Discogenic endplate marrow changes at L4-5. Mild diffuse intrinsic
canal narrowing on the basis of congenitally short pedicles.

Conus medullaris and cauda equina: Conus extends to the L1-2 level.
Conus and cauda equina appear normal.

Paraspinal and other soft tissues: Negative.

Disc levels:

T12-L1: No disc protrusion. Mild bilateral facet arthropathy. No
foraminal or canal stenosis.

L1-L2: No disc protrusion. Mild bilateral facet arthropathy. No
foraminal or canal stenosis.

L2-L3: Circumferential disc bulge with small central disc
protrusion. Bilateral facet arthropathy and ligamentum flavum
buckling. Findings contribute to mild canal stenosis. No significant
foraminal stenosis.

L3-L4: Circumferential disc bulge with bilateral facet arthropathy
and ligamentum flavum buckling. Findings result in moderate to
severe canal stenosis with mild bilateral foraminal stenosis.

L4-L5: Circumferential disc osteophyte complex with bilateral facet
arthropathy and ligamentum flavum buckling. Findings result in
moderate canal stenosis with moderate bilateral foraminal stenosis.

L5-S1: Disc osteophyte complex, eccentric to the right with advanced
bilateral facet arthropathy. Moderate canal stenosis with right
worse than left subarticular recess stenosis and moderate to severe
right foraminal stenosis. Mild left foraminal stenosis.
IMPRESSION: 1. Multilevel degenerative changes of the lumbar spine superimposed
on a congenitally narrowed canal. Findings are most pronounced at
the L3-4 level where there is moderate-to-severe canal stenosis and
mild bilateral foraminal stenosis.
2. Moderate canal stenosis at L4-5 and L5-S1 with moderate bilateral
foraminal stenosis at L4-5 and moderate-to-severe right foraminal
stenosis at L5-S1.

## 2021-01-01 NOTE — Telephone Encounter (Signed)
I spoke with patient and made follow up appointment to review scan with Dr. Ophelia Charter on Thursday in the [facility] office. She is unsure that she will be able to have surgery prior to 01/19/2021 due to inability to get leave from work.  Dr. Ophelia Charter to call patient.

## 2021-01-01 NOTE — Telephone Encounter (Signed)
Report was received by Dr. Ophelia Charter and patient has been contacted.

## 2021-01-01 NOTE — Telephone Encounter (Signed)
I left voicemail for patient requesting return call. Per Dr. Ophelia Charter, patient will need cervical fusion and he has posted her for Friday morning for surgery. He will need for her to come in to office ASAP to review MRI. Please let me know when patient returns call.

## 2021-01-01 NOTE — Telephone Encounter (Signed)
Radiology called wondering if you received the report from the MRI?  CB [phone]

## 2021-01-01 NOTE — Telephone Encounter (Signed)
noted 

## 2021-01-03 ENCOUNTER — Other Ambulatory Visit: Payer: Self-pay

## 2021-01-03 ENCOUNTER — Encounter: Payer: Self-pay | Admitting: Orthopaedic Surgery

## 2021-01-03 ENCOUNTER — Ambulatory Visit (INDEPENDENT_AMBULATORY_CARE_PROVIDER_SITE_OTHER): Payer: BC Managed Care – PPO | Admitting: Orthopaedic Surgery

## 2021-01-03 ENCOUNTER — Encounter (HOSPITAL_COMMUNITY): Payer: Self-pay | Admitting: Orthopaedic Surgery

## 2021-01-03 VITALS — Ht 66.0 in | Wt 206.0 lb

## 2021-01-03 DIAGNOSIS — M4712 Other spondylosis with myelopathy, cervical region: Secondary | ICD-10-CM | POA: Diagnosis not present

## 2021-01-03 NOTE — Progress Notes (Signed)
Office Visit Note   Patient: Jamie Jacobson           Date of Birth: Apr 05, 1966           MRN: [id number] Visit Date: 01/03/2021              Requested by: Gwenlyn Fudge, FNP [address] [place],  [place] [address] PCP: Gwenlyn Fudge, FNP   Assessment & Plan: Visit Diagnoses:  1. Other spondylosis with myelopathy, cervical region     Plan: Patient has cord compression severe at C6-7 with moderate to severe stenosis at see 5-6.  Plan would be C5-6, C6-7 two-level anterior cervical discectomy and fusion with allograft and plate.  She understands that she may get some partial return and improvement in her balance.  Surgery scheduled for tomorrow.  She does have lumbar disease with some areas of stenosis but treatment of this will be deferred and she needs to urgently get the cord decompressed and stabilized with removal of a large central disc herniation causing severe cord compression at C6-7.  Risks of dural tear, paralysis, pseudoarthrosis, reoperation, dysphagia, dysphonia discussed in detail she understands request to proceed.  Follow-Up Instructions: No follow-ups on file.   Orders:  No orders of the defined types were placed in this encounter.  No orders of the defined types were placed in this encounter.     Procedures: No procedures performed   Clinical Data: No additional findings.   Subjective: Chief Complaint  Patient presents with   Neck - Pain, Follow-up    MRI cervical spine review    HPI 55 year old female returns for cervical stenosis with myelopathy.  She has had wide-based gait legs giving way at times she has had episodes of incontinence of urine.  Numbness tingling in her hands but no weakness in her arms.  She is also noticed she has been somewhat numb from the waist down but can still walk although she does have noted weakness in her legs.  Cervical and lumbar MRI scan has been obtained and is available for review.  Lumbar MRI shows multilevel  lumbar degenerative changes with some moderate to severe stenosis at L3-4 and moderate at L4-5 and L5-S1.  Cervical MRI shows severe cord compression with large disc extrusion and edema of the cord with abnormal T2 hyperintensity.  She has 75% of her canal at see 6-7 occupied by the large central disc herniation with severe compression.  She also has spinal stenosis with the right cord flattening at C5-6.  Less severe changes at C3 -4 and C4 -5.  Review of Systems   Objective: Vital Signs: Ht 5\' 6"  (1.676 m)   Wt 206 lb (93.4 kg)   BMI 33.25 kg/m   Physical Exam Constitutional:      Appearance: She is well-developed.  HENT:     Head: Normocephalic.     Right Ear: External ear normal.     Left Ear: External ear normal. There is no impacted cerumen.  Eyes:     Pupils: Pupils are equal, round, and reactive to light.  Neck:     Thyroid: No thyromegaly.     Trachea: No tracheal deviation.  Cardiovascular:     Rate and Rhythm: Normal rate.  Pulmonary:     Effort: Pulmonary effort is normal.  Abdominal:     Palpations: Abdomen is soft.  Musculoskeletal:     Cervical back: No rigidity.  Skin:    General: Skin is warm and dry.  Neurological:     Mental Status: She is alert and oriented to person, place, and time.  Psychiatric:        Behavior: Behavior normal.    Ortho Exam patient has intact upper extremity reflexes.  Slight wide-based gait decreased balance.  3-4+ lower extremity reflexes without clonus.  Biceps triceps taken resistance no pinching in the shoulder.  She has positive Lhermitte, positive Spurling right and left severe brachial plexus tenderness increased pain with compression no improvement with distraction.  Specialty Comments:  No specialty comments available.  Imaging: CLINICAL DATA:  Neck pain with bilateral shoulder and arm pain for 2 months   EXAM: MRI CERVICAL SPINE WITHOUT CONTRAST   TECHNIQUE: Multiplanar, multisequence MR imaging of the cervical  spine was performed. No intravenous contrast was administered.   COMPARISON:  None.   FINDINGS: Alignment: Degenerative reversal of cervical lordosis with mild C2-3 anterolisthesis.   Vertebrae: No fracture, evidence of discitis, or bone lesion.   Cord: T2 hyperintensity in the compressed cord at the level of C6-7.   Posterior Fossa, vertebral arteries, paraspinal tissues: No evidence of perispinal mass or inflammation. 7 mm right thyroid nodule. No followup recommended (ref: J Am Coll Radiol. 2015 Feb;12(2): 143-50).   Disc levels:   C2-3: Unremarkable.   C3-4: Disc narrowing with bulging. Endplate and uncovertebral ridging with biforaminal impingement. Spinal stenosis with ventral cord indentation.   C4-5: Disc narrowing and bulging with uncovertebral ridging bilaterally. Spinal stenosis with ventral cord flattening. Biforaminal impingement   C5-6: Disc narrowing and bulging with bulky right eccentric ridging. Spinal stenosis with right cord flattening. Right more than left foraminal impingement   C6-7: Large disc herniation with cord compression and T2 hyperintensity. Right foraminal protrusion and impingement.   C7-T1:Unremarkable.   These results will be called to the ordering clinician or representative by the Radiologist Assistant, and communication documented in the PACS or [employer].   IMPRESSION: 1. C6-7 disc extrusion with cord compression and edema. 2. Chronic degenerative spinal stenosis at C3-4 to C5-6 with ventral cord flattening. 3. Foraminal impingement at C3-4 to C6-7, as above.     Electronically Signed   By: Marnee Spring M.D.   On: 01/01/2021 07:32  CLINICAL DATA:  open MRI lumbar spine eval for lumbar stenosis-falling with bilateral lower extremity numbness   EXAM: MRI LUMBAR SPINE WITHOUT CONTRAST   TECHNIQUE: Multiplanar, multisequence MR imaging of the lumbar spine was performed. No intravenous contrast was  administered.   COMPARISON:  None.   FINDINGS: Segmentation: Presumed standard anatomy with the inferior-most well developed disc space designated as L5-S1.   Alignment:  Physiologic.   Vertebrae: No fracture, evidence of discitis, or bone lesion. Discogenic endplate marrow changes at L4-5. Mild diffuse intrinsic canal narrowing on the basis of congenitally short pedicles.   Conus medullaris and cauda equina: Conus extends to the L1-2 level. Conus and cauda equina appear normal.   Paraspinal and other soft tissues: Negative.   Disc levels:   T12-L1: No disc protrusion. Mild bilateral facet arthropathy. No foraminal or canal stenosis.   L1-L2: No disc protrusion. Mild bilateral facet arthropathy. No foraminal or canal stenosis.   L2-L3: Circumferential disc bulge with small central disc protrusion. Bilateral facet arthropathy and ligamentum flavum buckling. Findings contribute to mild canal stenosis. No significant foraminal stenosis.   L3-L4: Circumferential disc bulge with bilateral facet arthropathy and ligamentum flavum buckling. Findings result in moderate to severe canal stenosis with mild bilateral foraminal stenosis.   L4-L5: Circumferential disc  osteophyte complex with bilateral facet arthropathy and ligamentum flavum buckling. Findings result in moderate canal stenosis with moderate bilateral foraminal stenosis.   L5-S1: Disc osteophyte complex, eccentric to the right with advanced bilateral facet arthropathy. Moderate canal stenosis with right worse than left subarticular recess stenosis and moderate to severe right foraminal stenosis. Mild left foraminal stenosis.   IMPRESSION: 1. Multilevel degenerative changes of the lumbar spine superimposed on a congenitally narrowed canal. Findings are most pronounced at the L3-4 level where there is moderate-to-severe canal stenosis and mild bilateral foraminal stenosis. 2. Moderate canal stenosis at L4-5 and L5-S1  with moderate bilateral foraminal stenosis at L4-5 and moderate-to-severe right foraminal stenosis at L5-S1.     Electronically Signed   By: Duanne Guess D.O.   On: 01/01/2021 08:05 PMFS History: Patient Active Problem List   Diagnosis Date Noted   Other spondylosis with myelopathy, cervical region 12/28/2020   Other intervertebral disc degeneration, lumbar region 12/28/2020   Mixed hyperlipidemia    Past Medical History:  Diagnosis Date   Medical history non-contributory    Mixed hyperlipidemia 06/10/2019    Family History  Problem Relation Age of Onset   Hypertension Mother    Cancer Father        bone marrow   Thyroid disease Sister    Thyroid disease Brother    Hypertension Maternal Grandmother    Emphysema Paternal Grandmother    Diabetes Paternal Grandfather    Thyroid disease Brother     No past surgical history on file. Social History   Occupational History   Not on file  Tobacco Use   Smoking status: Former    Types: Cigarettes    Quit date: 06/09/2018    Years since quitting: 2.5   Smokeless tobacco: Never  Vaping Use   Vaping Use: Never used  Substance and Sexual Activity   Alcohol use: Yes    Comment: occ   Drug use: Never   Sexual activity: Not on file

## 2021-01-03 NOTE — Progress Notes (Signed)
Spoke with pt. To arrive at 0700 to have covid test done.

## 2021-01-04 ENCOUNTER — Encounter (HOSPITAL_COMMUNITY)
Admission: RE | Disposition: A | Discharge status: Home / Self Care | Payer: Self-pay | Source: Home / Self Care | Attending: Orthopaedic Surgery

## 2021-01-04 ENCOUNTER — Ambulatory Visit (HOSPITAL_COMMUNITY): Payer: BC Managed Care – PPO | Admitting: Anesthesiology

## 2021-01-04 ENCOUNTER — Ambulatory Visit (HOSPITAL_COMMUNITY): Payer: BC Managed Care – PPO

## 2021-01-04 ENCOUNTER — Encounter (HOSPITAL_COMMUNITY): Payer: Self-pay | Admitting: Orthopaedic Surgery

## 2021-01-04 ENCOUNTER — Observation Stay (HOSPITAL_COMMUNITY)
Admission: RE | Admit: 2021-01-04 | Discharge: 2021-01-05 | Disposition: A | Discharge status: Home / Self Care | Payer: BC Managed Care – PPO | Attending: Orthopaedic Surgery | Admitting: Orthopaedic Surgery

## 2021-01-04 ENCOUNTER — Other Ambulatory Visit: Payer: Self-pay

## 2021-01-04 DIAGNOSIS — M5136 Other intervertebral disc degeneration, lumbar region: Secondary | ICD-10-CM | POA: Diagnosis not present

## 2021-01-04 DIAGNOSIS — M4802 Spinal stenosis, cervical region: Secondary | ICD-10-CM | POA: Diagnosis present

## 2021-01-04 DIAGNOSIS — Z981 Arthrodesis status: Secondary | ICD-10-CM | POA: Diagnosis not present

## 2021-01-04 DIAGNOSIS — Z419 Encounter for procedure for purposes other than remedying health state, unspecified: Secondary | ICD-10-CM

## 2021-01-04 DIAGNOSIS — Z20822 Contact with and (suspected) exposure to covid-19: Secondary | ICD-10-CM | POA: Insufficient documentation

## 2021-01-04 DIAGNOSIS — Z87891 Personal history of nicotine dependence: Secondary | ICD-10-CM | POA: Insufficient documentation

## 2021-01-04 DIAGNOSIS — M9971 Connective tissue and disc stenosis of intervertebral foramina of cervical region: Secondary | ICD-10-CM | POA: Diagnosis not present

## 2021-01-04 DIAGNOSIS — M50023 Cervical disc disorder at C6-C7 level with myelopathy: Secondary | ICD-10-CM | POA: Diagnosis not present

## 2021-01-04 DIAGNOSIS — M4712 Other spondylosis with myelopathy, cervical region: Secondary | ICD-10-CM | POA: Diagnosis not present

## 2021-01-04 DIAGNOSIS — M4322 Fusion of spine, cervical region: Secondary | ICD-10-CM | POA: Diagnosis not present

## 2021-01-04 HISTORY — PX: ANTERIOR CERVICAL DECOMP/DISCECTOMY FUSION: SHX1161

## 2021-01-04 HISTORY — DX: Other specified health status: Z78.9

## 2021-01-04 LAB — COMPREHENSIVE METABOLIC PANEL
ALT: 18 U/L (ref 0–44)
AST: 23 U/L (ref 15–41)
Albumin: 3.6 g/dL (ref 3.5–5.0)
Alkaline Phosphatase: 56 U/L (ref 38–126)
Anion gap: 10 (ref 5–15)
BUN: 17 mg/dL (ref 6–20)
CO2: 24 mmol/L (ref 22–32)
Calcium: 8.9 mg/dL (ref 8.9–10.3)
Chloride: 103 mmol/L (ref 98–111)
Creatinine, Ser: 0.71 mg/dL (ref 0.44–1.00)
GFR, Estimated: >60 mL/min (ref >60)
Glucose, Bld: 98 mg/dL (ref 70–99)
Potassium: 4 mmol/L (ref 3.5–5.1)
Sodium: 137 mmol/L (ref 135–145)
Total Bilirubin: 0.6 mg/dL (ref 0.3–1.2)
Total Protein: 6.6 g/dL (ref 6.5–8.1)

## 2021-01-04 LAB — URINALYSIS, ROUTINE W REFLEX MICROSCOPIC
Bilirubin Urine: NEGATIVE
Glucose, UA: NEGATIVE mg/dL
Ketones, ur: NEGATIVE mg/dL
Nitrite: NEGATIVE
Protein, ur: NEGATIVE mg/dL
Specific Gravity, Urine: 1.028 (ref 1.005–1.030)
pH: 5 (ref 5.0–8.0)

## 2021-01-04 LAB — CBC
HCT: 37.8 % (ref 36.0–46.0)
Hemoglobin: 11.9 g/dL — ABNORMAL LOW (ref 12.0–15.0)
MCH: 28.6 pg (ref 26.0–34.0)
MCHC: 31.5 g/dL (ref 30.0–36.0)
MCV: 90.9 fL (ref 80.0–100.0)
Platelets: 327 10*3/uL (ref 150–400)
RBC: 4.16 MIL/uL (ref 3.87–5.11)
RDW: 13.6 % (ref 11.5–15.5)
WBC: 7.5 10*3/uL (ref 4.0–10.5)
nRBC: 0 % (ref 0.0–0.2)

## 2021-01-04 LAB — SARS CORONAVIRUS 2 BY RT PCR (HOSPITAL ORDER, PERFORMED IN ~~LOC~~ HOSPITAL LAB): SARS Coronavirus 2: NEGATIVE

## 2021-01-04 LAB — SURGICAL PCR SCREEN
MRSA, PCR: NEGATIVE
Staphylococcus aureus: NEGATIVE

## 2021-01-04 IMAGING — RF DG CERVICAL SPINE 2 OR 3 VIEWS
1 series · 4 of 4 positions shown · non-contrast
Comparison: MRI of the cervical spine [DATE].

CLINICAL DATA: Surgery, elective [FM] ([FM]-CM). Additional
history provided by [HOSPITAL]-6, C6-7 anterior cervical
discectomy fusion, allograft, plate. Provided fluoroscopy time 24
seconds (4.19 mGy).

EXAM:
CERVICAL SPINE - 2-3 VIEW; DG C-ARM 1-60 MIN

[Series 1: run · 4 of 4 slices shown]
[im 1/4]
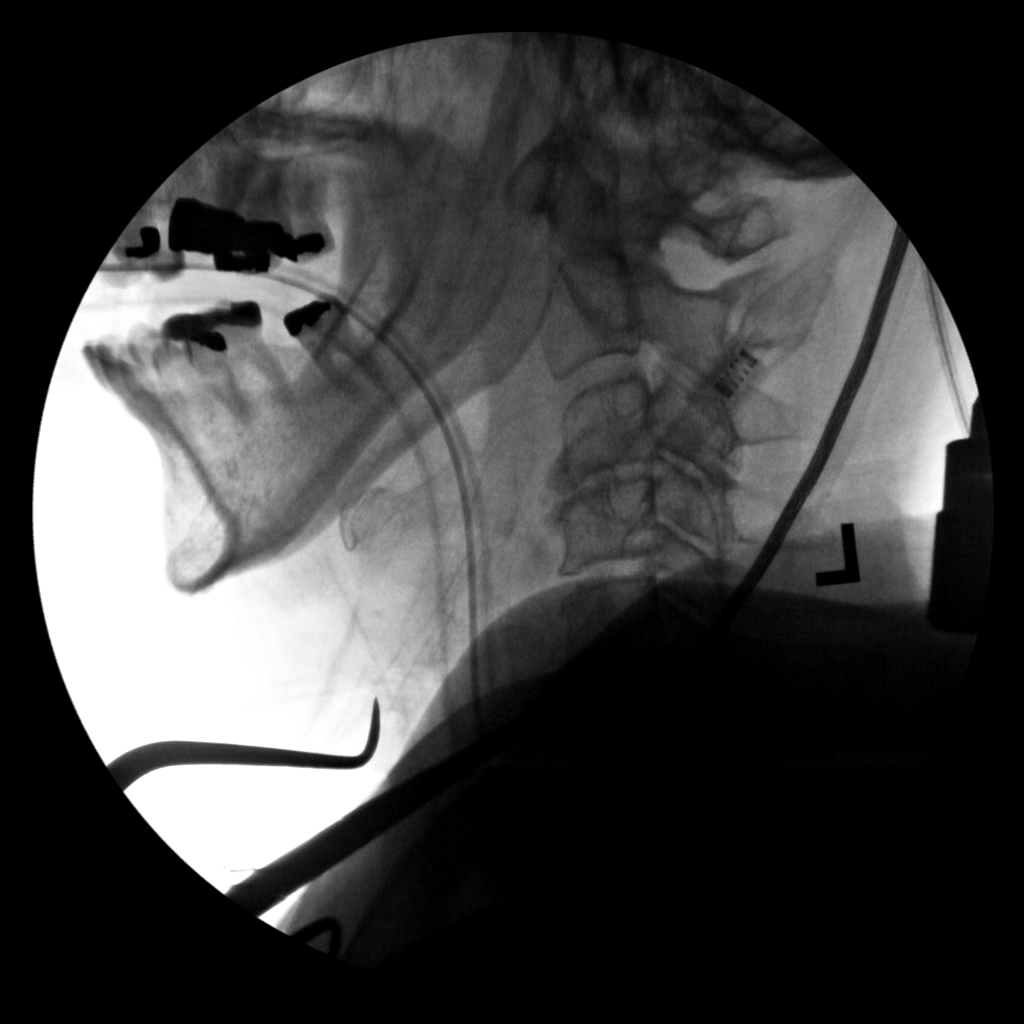
[im 2/4]
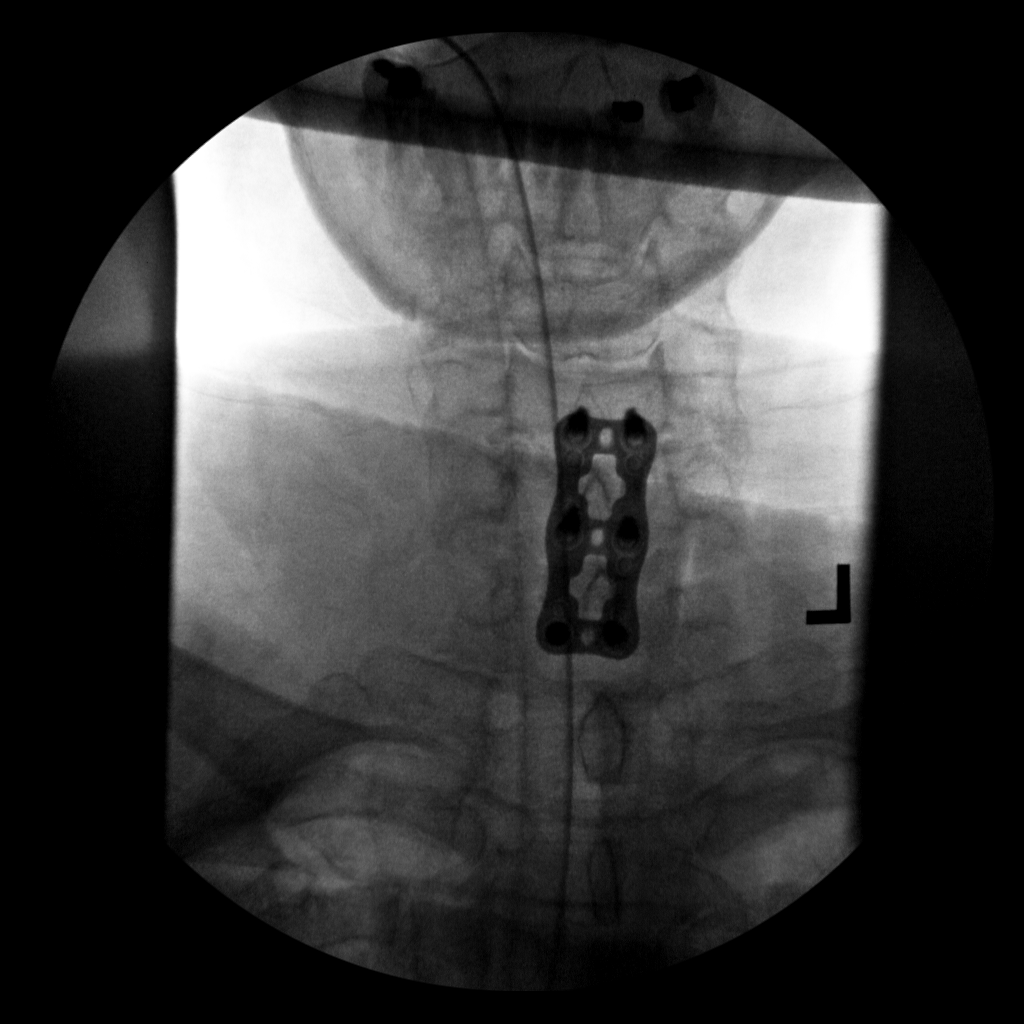
[im 3/4]
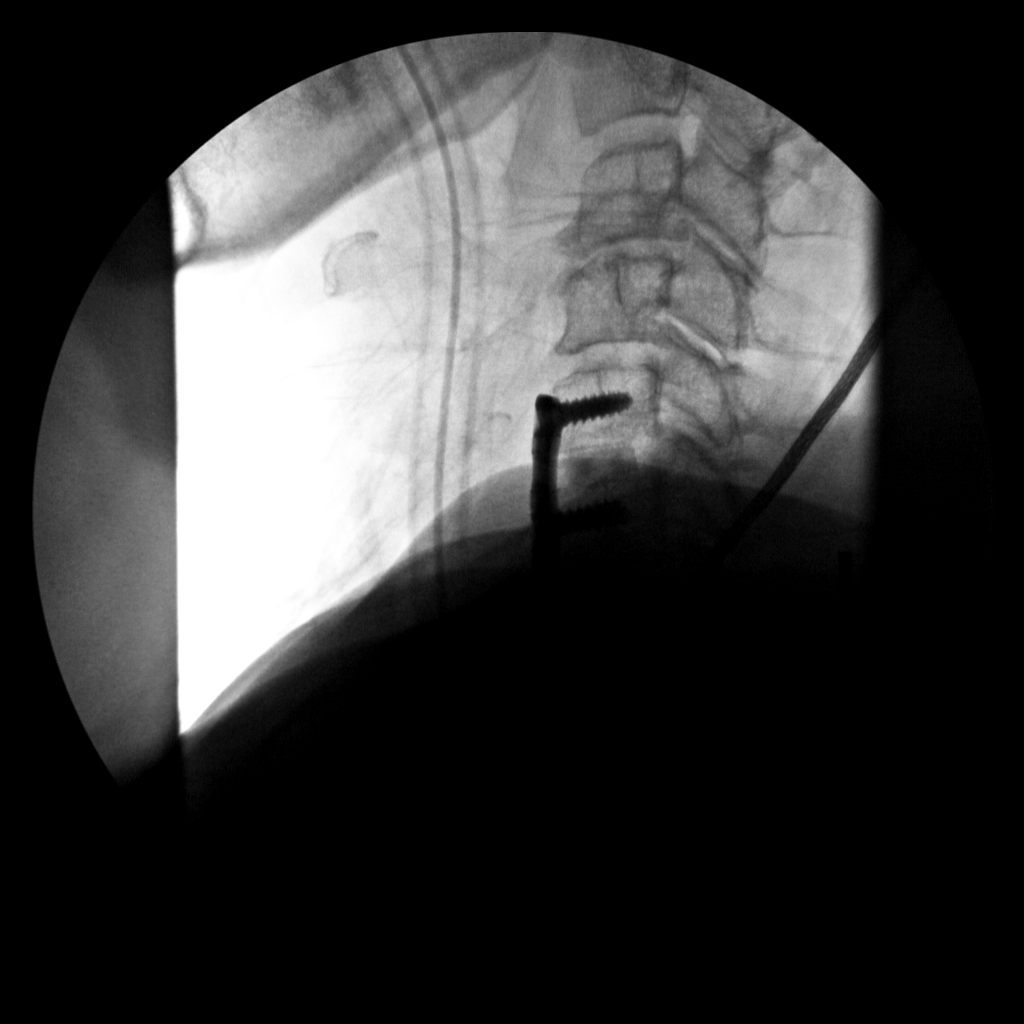
[im 4/4]
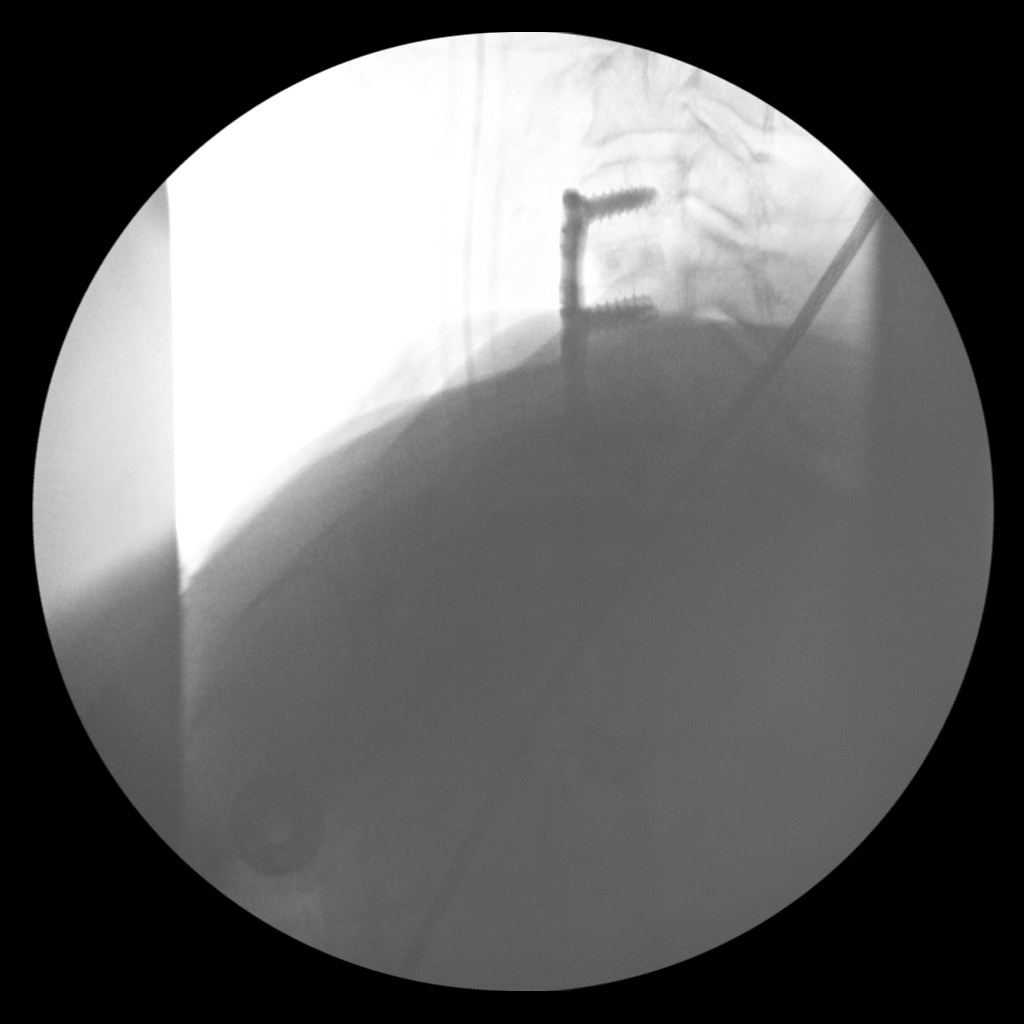

[4 of 4 positions shown; findings below may reference images not displayed]

FINDINGS: AP and lateral view intraoperative fluoroscopic images of the
cervical spine are submitted, 4 images total. On the most recent
provided images, ACDF hardware spans the C5-C7 levels (ventral plate
and screws, as well as interbody devices). Partially visualized ET
tube.
IMPRESSION: Three intraoperative fluoroscopic images of the cervical spine from
C5-C7 ACDF.

## 2021-01-04 IMAGING — RF DG C-ARM 1-60 MIN
1 series · 4 of 4 positions shown · non-contrast
Comparison: MRI of the cervical spine [DATE].

CLINICAL DATA: Surgery, elective [FM] ([FM]-CM). Additional
history provided by [HOSPITAL]-6, C6-7 anterior cervical
discectomy fusion, allograft, plate. Provided fluoroscopy time 24
seconds (4.19 mGy).

EXAM:
CERVICAL SPINE - 2-3 VIEW; DG C-ARM 1-60 MIN

[Series 1: run · 4 of 4 slices shown]
[im 1/4]
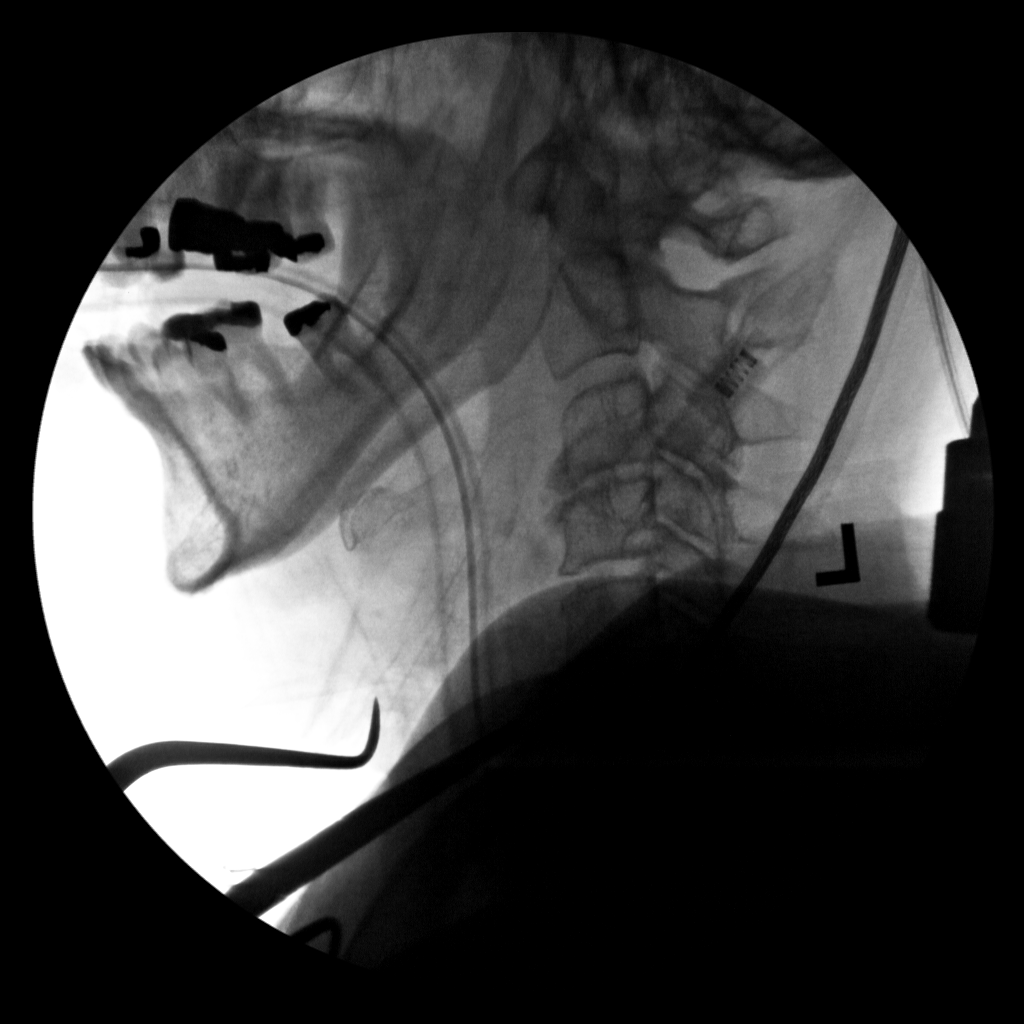
[im 2/4]
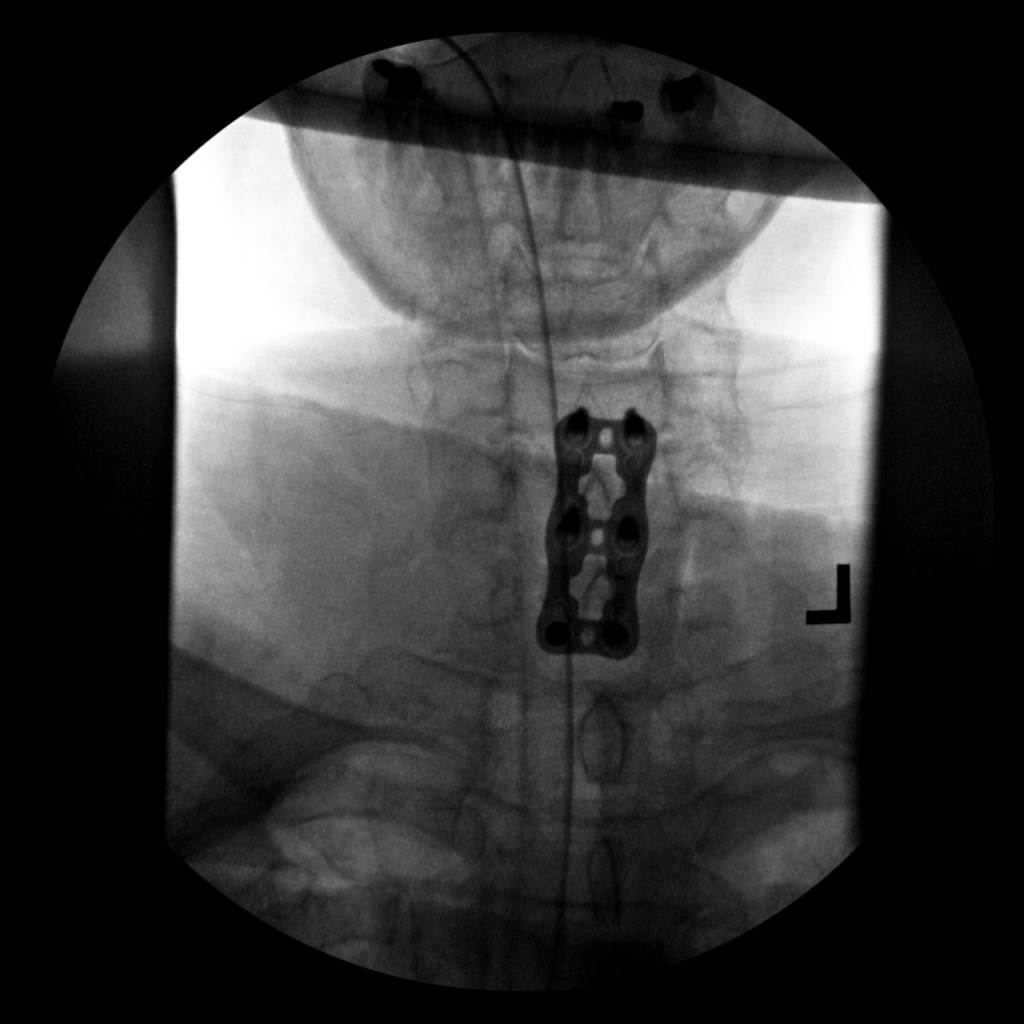
[im 3/4]
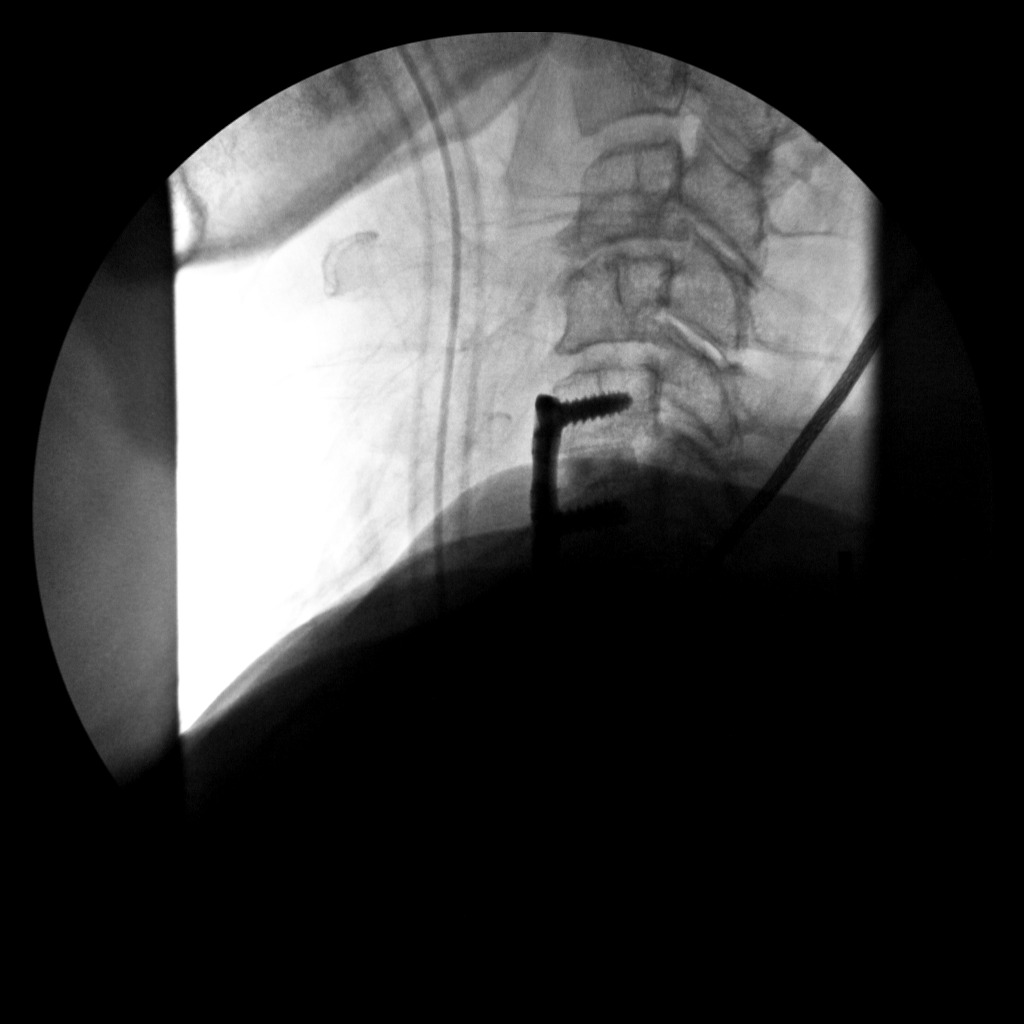
[im 4/4]
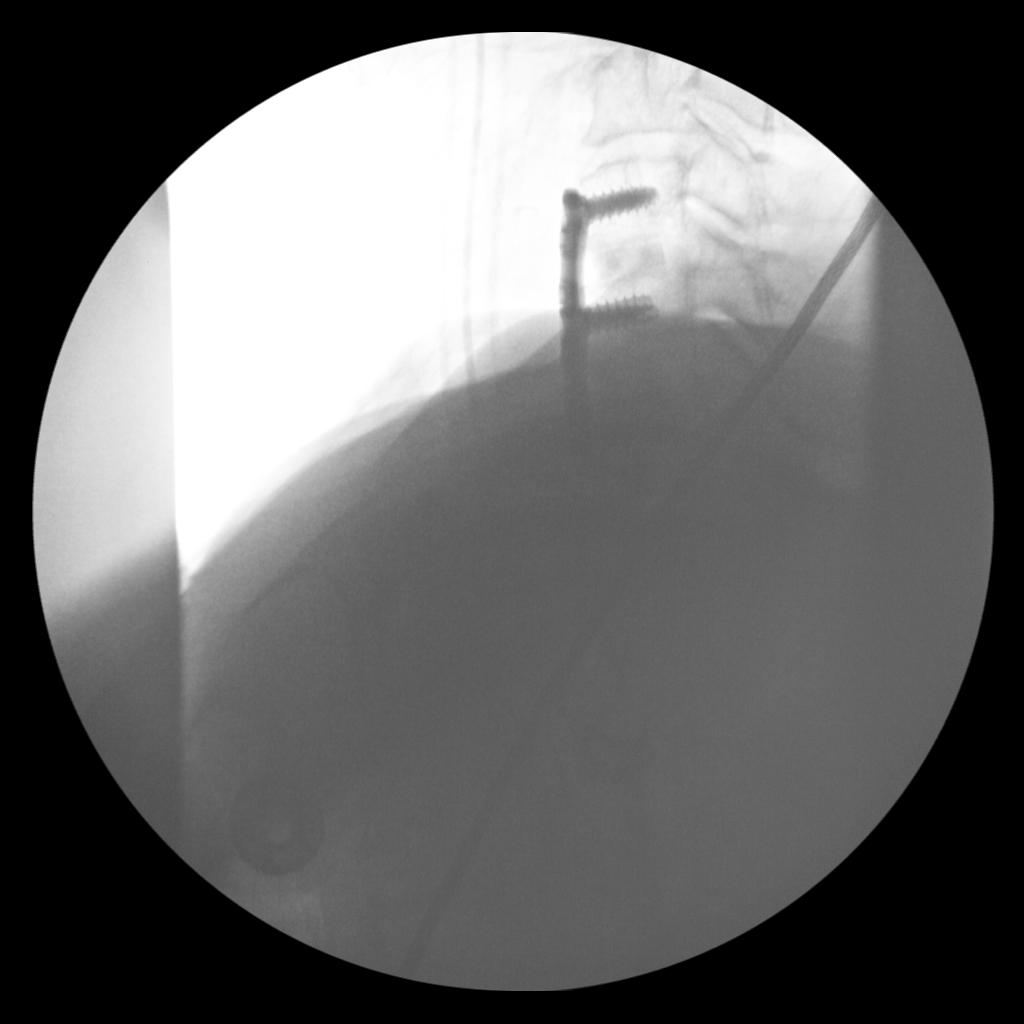

[4 of 4 positions shown; findings below may reference images not displayed]

FINDINGS: AP and lateral view intraoperative fluoroscopic images of the
cervical spine are submitted, 4 images total. On the most recent
provided images, ACDF hardware spans the C5-C7 levels (ventral plate
and screws, as well as interbody devices). Partially visualized ET
tube.
IMPRESSION: Three intraoperative fluoroscopic images of the cervical spine from
C5-C7 ACDF.

## 2021-01-04 SURGERY — ANTERIOR CERVICAL DECOMPRESSION/DISCECTOMY FUSION 2 LEVELS
Anesthesia: General

## 2021-01-04 MED ORDER — PROMETHAZINE HCL 25 MG/ML IJ SOLN: 6.2500 mg | INTRAMUSCULAR | Status: DC | PRN

## 2021-01-04 MED ORDER — ONDANSETRON HCL 4 MG/2ML IJ SOLN: 4.0000 mg | Freq: Four times a day (QID) | INTRAMUSCULAR | Status: DC | PRN

## 2021-01-04 MED ORDER — FENTANYL CITRATE (PF) 250 MCG/5ML IJ SOLN
INTRAMUSCULAR | Status: AC
  Filled 2021-01-04: qty 5

## 2021-01-04 MED ORDER — OXYCODONE HCL 5 MG PO TABS: 5.0000 mg | ORAL_TABLET | Freq: Once | ORAL | Status: DC | PRN

## 2021-01-04 MED ORDER — HYDROMORPHONE HCL 1 MG/ML IJ SOLN
INTRAMUSCULAR | Status: DC | PRN
  Administered 2021-01-04: .5 mg via INTRAVENOUS

## 2021-01-04 MED ORDER — SODIUM CHLORIDE 0.9% FLUSH
3.0000 mL | Freq: Two times a day (BID) | INTRAVENOUS | Status: DC
  Administered 2021-01-04: 3 mL via INTRAVENOUS

## 2021-01-04 MED ORDER — PROPOFOL 10 MG/ML IV BOLUS
INTRAVENOUS | Status: DC | PRN
  Administered 2021-01-04: 150 mg via INTRAVENOUS

## 2021-01-04 MED ORDER — METHOCARBAMOL 500 MG PO TABS
500.0000 mg | ORAL_TABLET | Freq: Four times a day (QID) | ORAL | Status: DC | PRN
  Administered 2021-01-04 – 2021-01-05 (×2): 500 mg via ORAL
  Filled 2021-01-04 (×2): qty 1

## 2021-01-04 MED ORDER — AMISULPRIDE (ANTIEMETIC) 5 MG/2ML IV SOLN: 10.0000 mg | Freq: Once | INTRAVENOUS | Status: DC | PRN

## 2021-01-04 MED ORDER — MIDAZOLAM HCL 5 MG/5ML IJ SOLN
INTRAMUSCULAR | Status: DC | PRN
Start: 2021-01-04 — End: 2021-01-04
  Administered 2021-01-04: 2 mg via INTRAVENOUS

## 2021-01-04 MED ORDER — CHLORHEXIDINE GLUCONATE 0.12 % MT SOLN: 15.0000 mL | Freq: Once | OROMUCOSAL | Status: AC

## 2021-01-04 MED ORDER — ROCURONIUM BROMIDE 100 MG/10ML IV SOLN
INTRAVENOUS | Status: DC | PRN
Start: 2021-01-04 — End: 2021-01-04
  Administered 2021-01-04: 30 mg via INTRAVENOUS
  Administered 2021-01-04: 20 mg via INTRAVENOUS
  Administered 2021-01-04: 50 mg via INTRAVENOUS

## 2021-01-04 MED ORDER — HYDROMORPHONE HCL 1 MG/ML IJ SOLN: 0.5000 mg | INTRAMUSCULAR | Status: DC | PRN

## 2021-01-04 MED ORDER — SODIUM CHLORIDE 0.9% FLUSH: 3.0000 mL | INTRAVENOUS | Status: DC | PRN

## 2021-01-04 MED ORDER — BUPIVACAINE HCL 0.25 % IJ SOLN
INTRAMUSCULAR | Status: DC | PRN
  Administered 2021-01-04: 6 mL

## 2021-01-04 MED ORDER — SODIUM CHLORIDE 0.9 % IV SOLN: INTRAVENOUS | Status: DC

## 2021-01-04 MED ORDER — BUPIVACAINE HCL (PF) 0.25 % IJ SOLN
INTRAMUSCULAR | Status: AC
  Filled 2021-01-04: qty 30

## 2021-01-04 MED ORDER — LACTATED RINGERS IV SOLN: INTRAVENOUS | Status: DC

## 2021-01-04 MED ORDER — ACETAMINOPHEN 325 MG PO TABS
650.0000 mg | ORAL_TABLET | ORAL | Status: DC | PRN
  Administered 2021-01-04 – 2021-01-05 (×2): 650 mg via ORAL
  Filled 2021-01-04 (×2): qty 2

## 2021-01-04 MED ORDER — SUGAMMADEX SODIUM 500 MG/5ML IV SOLN
INTRAVENOUS | Status: DC | PRN
  Administered 2021-01-04: 400 mg via INTRAVENOUS

## 2021-01-04 MED ORDER — ACETAMINOPHEN 650 MG RE SUPP: 650.0000 mg | RECTAL | Status: DC | PRN

## 2021-01-04 MED ORDER — FENTANYL CITRATE (PF) 100 MCG/2ML IJ SOLN
25.0000 ug | INTRAMUSCULAR | Status: DC | PRN
  Administered 2021-01-04: 50 ug via INTRAVENOUS

## 2021-01-04 MED ORDER — OXYCODONE HCL 5 MG/5ML PO SOLN: 5.0000 mg | Freq: Once | ORAL | Status: DC | PRN

## 2021-01-04 MED ORDER — FENTANYL CITRATE (PF) 100 MCG/2ML IJ SOLN
INTRAMUSCULAR | Status: AC
  Filled 2021-01-04: qty 2

## 2021-01-04 MED ORDER — DOCUSATE SODIUM 100 MG PO CAPS
100.0000 mg | ORAL_CAPSULE | Freq: Two times a day (BID) | ORAL | Status: DC
  Administered 2021-01-04 – 2021-01-05 (×2): 100 mg via ORAL
  Filled 2021-01-04 (×2): qty 1

## 2021-01-04 MED ORDER — DEXAMETHASONE SODIUM PHOSPHATE 10 MG/ML IJ SOLN
INTRAMUSCULAR | Status: DC | PRN
  Administered 2021-01-04: 10 mg via INTRAVENOUS

## 2021-01-04 MED ORDER — CEFAZOLIN SODIUM-DEXTROSE 2-4 GM/100ML-% IV SOLN
2.0000 g | INTRAVENOUS | Status: AC
  Administered 2021-01-04: 2 g via INTRAVENOUS
  Filled 2021-01-04: qty 100

## 2021-01-04 MED ORDER — PHENOL 1.4 % MT LIQD: 1.0000 | OROMUCOSAL | Status: DC | PRN

## 2021-01-04 MED ORDER — PROPOFOL 10 MG/ML IV BOLUS
INTRAVENOUS | Status: AC
  Filled 2021-01-04: qty 20

## 2021-01-04 MED ORDER — OXYCODONE HCL 5 MG PO TABS
5.0000 mg | ORAL_TABLET | ORAL | Status: DC | PRN
  Administered 2021-01-04 – 2021-01-05 (×4): 5 mg via ORAL
  Filled 2021-01-04 (×4): qty 1

## 2021-01-04 MED ORDER — ORAL CARE MOUTH RINSE: 15.0000 mL | Freq: Once | OROMUCOSAL | Status: AC

## 2021-01-04 MED ORDER — 0.9 % SODIUM CHLORIDE (POUR BTL) OPTIME
TOPICAL | Status: DC | PRN
  Administered 2021-01-04: 1000 mL

## 2021-01-04 MED ORDER — ONDANSETRON HCL 4 MG/2ML IJ SOLN
INTRAMUSCULAR | Status: DC | PRN
  Administered 2021-01-04: 4 mg via INTRAVENOUS

## 2021-01-04 MED ORDER — LIDOCAINE HCL (CARDIAC) PF 100 MG/5ML IV SOSY
PREFILLED_SYRINGE | INTRAVENOUS | Status: DC | PRN
  Administered 2021-01-04: 80 mg via INTRAVENOUS

## 2021-01-04 MED ORDER — CHLORHEXIDINE GLUCONATE 0.12 % MT SOLN
OROMUCOSAL | Status: AC
  Administered 2021-01-04: 15 mL via OROMUCOSAL
  Filled 2021-01-04: qty 15

## 2021-01-04 MED ORDER — OXYCODONE-ACETAMINOPHEN 5-325 MG PO TABS: 1.0000 | ORAL_TABLET | Freq: Four times a day (QID) | ORAL | 0 refills | Status: AC | PRN

## 2021-01-04 MED ORDER — SODIUM CHLORIDE 0.9 % IV SOLN: 250.0000 mL | INTRAVENOUS | Status: DC

## 2021-01-04 MED ORDER — MIDAZOLAM HCL 2 MG/2ML IJ SOLN
INTRAMUSCULAR | Status: AC
  Filled 2021-01-04: qty 2

## 2021-01-04 MED ORDER — METHOCARBAMOL 1000 MG/10ML IJ SOLN
500.0000 mg | Freq: Four times a day (QID) | INTRAVENOUS | Status: DC | PRN
  Filled 2021-01-04: qty 5

## 2021-01-04 MED ORDER — ONDANSETRON HCL 4 MG PO TABS: 4.0000 mg | ORAL_TABLET | Freq: Four times a day (QID) | ORAL | Status: DC | PRN

## 2021-01-04 MED ORDER — FENTANYL CITRATE (PF) 100 MCG/2ML IJ SOLN
INTRAMUSCULAR | Status: DC | PRN
  Administered 2021-01-04: 100 ug via INTRAVENOUS
  Administered 2021-01-04: 50 ug via INTRAVENOUS
  Administered 2021-01-04: 100 ug via INTRAVENOUS

## 2021-01-04 MED ORDER — POLYETHYLENE GLYCOL 3350 17 G PO PACK
17.0000 g | PACK | Freq: Every day | ORAL | Status: DC
  Administered 2021-01-04 – 2021-01-05 (×2): 17 g via ORAL
  Filled 2021-01-04 (×2): qty 1

## 2021-01-04 MED ORDER — PHENYLEPHRINE HCL-NACL 20-0.9 MG/250ML-% IV SOLN
INTRAVENOUS | Status: DC | PRN
  Administered 2021-01-04: 40 ug/min via INTRAVENOUS
  Administered 2021-01-04: 50 ug/min via INTRAVENOUS

## 2021-01-04 MED ORDER — ACETAMINOPHEN 500 MG PO TABS
1000.0000 mg | ORAL_TABLET | Freq: Once | ORAL | Status: AC
  Administered 2021-01-04: 1000 mg via ORAL
  Filled 2021-01-04: qty 2

## 2021-01-04 MED ORDER — METHOCARBAMOL 500 MG PO TABS: 500.0000 mg | ORAL_TABLET | Freq: Four times a day (QID) | ORAL | 0 refills | Status: AC | PRN

## 2021-01-04 MED ORDER — MENTHOL 3 MG MT LOZG: 1.0000 | LOZENGE | OROMUCOSAL | Status: DC | PRN

## 2021-01-04 SURGICAL SUPPLY — 53 items
BAG COUNTER SPONGE SURGICOUNT (BAG) ×2 IMPLANT
BENZOIN TINCTURE PRP APPL 2/3 (GAUZE/BANDAGES/DRESSINGS) ×2 IMPLANT
BIT DRILL SM SPINE QC 12 (BIT) ×2 IMPLANT
BLADE CLIPPER SURG (BLADE) IMPLANT
BONE CC-ACS 11X14 X8 6D (Bone Implant) ×2 IMPLANT
BONE CC-ACS 11X14X6 6D (Bone Implant) ×2 IMPLANT
BUR ROUND FLUTED 4 SOFT TCH (BURR) ×2 IMPLANT
CHIPS BONE CANC-ACS 11X14X8 6D (Bone Implant) ×1 IMPLANT
CHIPS BONE CANC-ACS11X14X6 6D (Bone Implant) ×1 IMPLANT
COLLAR CERV LO CONTOUR FIRM DE (SOFTGOODS) ×2 IMPLANT
CORD BIPOLAR FORCEPS 12FT (ELECTRODE) ×2 IMPLANT
COVER SURGICAL LIGHT HANDLE (MISCELLANEOUS) ×2 IMPLANT
DRAPE C-ARM 42X72 X-RAY (DRAPES) ×2 IMPLANT
DRAPE HALF SHEET 40X57 (DRAPES) ×2 IMPLANT
DRAPE MICROSCOPE LEICA (MISCELLANEOUS) ×2 IMPLANT
DURAPREP 6ML APPLICATOR 50/CS (WOUND CARE) ×2 IMPLANT
ELECT COATED BLADE 2.86 ST (ELECTRODE) ×2 IMPLANT
ELECT REM PT RETURN 9FT ADLT (ELECTROSURGICAL) ×2
ELECTRODE REM PT RTRN 9FT ADLT (ELECTROSURGICAL) ×1 IMPLANT
EVACUATOR 1/8 PVC DRAIN (DRAIN) ×2 IMPLANT
GAUZE SPONGE 4X4 12PLY STRL (GAUZE/BANDAGES/DRESSINGS) ×2 IMPLANT
GLOVE SRG 8 PF TXTR STRL LF DI (GLOVE) ×2 IMPLANT
GLOVE SURG ORTHO LTX SZ7.5 (GLOVE) ×4 IMPLANT
GLOVE SURG UNDER POLY LF SZ8 (GLOVE) ×2
GOWN STRL REUS W/ TWL LRG LVL3 (GOWN DISPOSABLE) ×1 IMPLANT
GOWN STRL REUS W/ TWL XL LVL3 (GOWN DISPOSABLE) ×1 IMPLANT
GOWN STRL REUS W/TWL 2XL LVL3 (GOWN DISPOSABLE) ×2 IMPLANT
GOWN STRL REUS W/TWL LRG LVL3 (GOWN DISPOSABLE) ×1
GOWN STRL REUS W/TWL XL LVL3 (GOWN DISPOSABLE) ×1
HALTER HD/CHIN CERV TRACTION D (MISCELLANEOUS) ×2 IMPLANT
HEMOSTAT SURGICEL 2X14 (HEMOSTASIS) IMPLANT
KIT BASIN OR (CUSTOM PROCEDURE TRAY) ×2 IMPLANT
KIT TURNOVER KIT B (KITS) ×2 IMPLANT
MANIFOLD NEPTUNE II (INSTRUMENTS) IMPLANT
NEEDLE 25GX 5/8IN NON SAFETY (NEEDLE) ×2 IMPLANT
NS IRRIG 1000ML POUR BTL (IV SOLUTION) ×2 IMPLANT
PACK ORTHO CERVICAL (CUSTOM PROCEDURE TRAY) ×2 IMPLANT
PAD ARMBOARD 7.5X6 YLW CONV (MISCELLANEOUS) ×4 IMPLANT
PATTIES SURGICAL .5 X.5 (GAUZE/BANDAGES/DRESSINGS) IMPLANT
PIN FIXATION TEMP W/SHOULDER (PIN) ×2 IMPLANT
PLATE ANT CERV XTEND 2 LV (Plate) ×2 IMPLANT
POSITIONER HEAD DONUT 9IN (MISCELLANEOUS) ×2 IMPLANT
RESTRAINT LIMB HOLDER UNIV (RESTRAINTS) IMPLANT
SCREW XTD VAR 4.2 SELF TAP 12 (Screw) ×12 IMPLANT
STRIP CLOSURE SKIN 1/2X4 (GAUZE/BANDAGES/DRESSINGS) ×2 IMPLANT
SURGIFLO W/THROMBIN 8M KIT (HEMOSTASIS) ×2 IMPLANT
SUT BONE WAX W31G (SUTURE) ×2 IMPLANT
SUT VIC AB 3-0 X1 27 (SUTURE) ×2 IMPLANT
SUT VICRYL 4-0 PS2 18IN ABS (SUTURE) ×4 IMPLANT
TOWEL GREEN STERILE (TOWEL DISPOSABLE) ×2 IMPLANT
TOWEL GREEN STERILE FF (TOWEL DISPOSABLE) ×2 IMPLANT
TRAY FOLEY W/BAG SLVR 16FR (SET/KITS/TRAYS/PACK)
TRAY FOLEY W/BAG SLVR 16FR ST (SET/KITS/TRAYS/PACK) IMPLANT

## 2021-01-04 NOTE — Op Note (Signed)
Preop diagnosis: Cervical spondylosis C5-6 C6-7.  C6-7 disc protrusion with cord compression and myelopathy.  Postop diagnosis: Same  Procedure: C5-6, C6-7 anterior cervical discectomy and fusion, allograft and plate.  Surgeon: Annell Greening, MD  Assistant: Zonia Kief, PA-C medically necessary and present for the entire procedure  Anesthesia General oral tracheal with glide scope +6 cc Marcaine skin local  Drains 1 Hemovac neck.  Implants: Globus 34 mmXTend plate 12 mm screws x6.  6 mm graft cortical cancellous allograft at C5-6 level and 8 mm graft at C6-7 level.  Finding: severe cord compression at C6-7 with large disc herniation and severe cord compression.  Cervical spondylosis with moderate to severe compression at C5-6.  Procedure: After induction of general anesthesia Glide scope intubation arm tucked to side with wrist restraints Foley catheter was placed due to patient's problems with incontinence.  Neck was prepped after had ultra traction was applied.  Foam pads placed over the ulnar nerve and sheet secured over the top with arms at the side.  Next prepped with DuraPrep there is cord with towel sterile skin marker Betadine Steri-Drape sterile Mayo stand the head thyroid sheets and drapes.  Timeout procedure was completed Ancef prophylaxis.  Incision was made at the midline extending to the left platysma was split in line with the fibers blunt dissection down to the prominent C5-6 small spur anteriorly with confirmation with short 25 needle.  C6-7 was addressed first and initially we took a chunk of disc at the [facility] for identification and then slid 1 level down to C6-7 also removing a chunk of disc out.  Self-retaining plate retractors were placed with teeth blades right and left smooth blade cephalad caudad.  Discectomy was performed operative microscope was brought in and progressing back to the posterior longitudinal ligament taking down longitudinal ligament and removing multiple large  fragments until the dura was completely decompressed.  We took some additional bone off the top of C7 since some of the fragments had pushed back behind the C7 vertebral body.  Once these were all completely decompressed some Surgi-Flo was used where there was some epidural oozing and bone oozing.  Field was dry a millimeter graft was selected based on sizers countersunk 2 mm with CRNA pulling traction as a graft was gently countersunk.  Identical procedure repeated at C5-6 level.  This had moderate to severe stenosis.  Posterior longitudinal ligament taken down.  6 mm graft was used at this level countersunk 2 mm.  34 mm plate was selected adjusted held with a small spike and then screws were placed suggesting an AP and lateral.  Final spot pictures were taken confirming good position IP AP and lateral and tiny screwdriver was used to lock all screws down.  Hemovacs placed in and out technique.  Platysma closed with 3-0 Vicryl 4-0 Vicryl subcuticular closure tincture benzoin Steri-Strips Marcaine 6 cc local 4 x 4's tape and soft cervical collar patient tolerated procedure well and reported improved strength and improvement in lower extremity numbness and hand numbness postop.

## 2021-01-04 NOTE — Transfer of Care (Signed)
Immediate Anesthesia Transfer of Care Note  Patient: Jamie Jacobson  Procedure(s) Performed: C5-6, C6-7 ANTERIOR CERVICAL DISCECTOMY FUSION, ALLOGRAFT, PLATE  Patient Location: PACU  Anesthesia Type:General  Level of Consciousness: awake, alert , oriented and patient cooperative  Airway & Oxygen Therapy: Patient Spontanous Breathing and Patient connected to face mask oxygen  Post-op Assessment: Report given to RN, Post -op Vital signs reviewed and stable and Patient moving all extremities X 4  Post vital signs: Reviewed and stable  Last Vitals:  Vitals Value Taken Time  BP 130/71 01/04/21 1257  Temp    Pulse 101 01/04/21 1300  Resp 14 01/04/21 1300  SpO2 95 % 01/04/21 1300  Vitals shown include unvalidated device data.  Last Pain:  Vitals:   01/04/21 0749  TempSrc:   PainSc: 0-No pain      Patients Stated Pain Goal: 5 (01/04/21 0749)  Complications: No notable events documented.

## 2021-01-04 NOTE — Interval H&P Note (Signed)
History and Physical Interval Note:  01/04/2021 9:41 AM  Jamie Jacobson  has presented today for surgery, with the diagnosis of C5-6 and C6-7 cervical stenosis, myelopathy.  The various methods of treatment have been discussed with the patient and family. After consideration of risks, benefits and other options for treatment, the patient has consented to  Procedure(s): C5-6, C6-7 ANTERIOR CERVICAL DISCECTOMY FUSION, ALLOGRAFT, PLATE (N/A) as a surgical intervention.  The patient's history has been reviewed, patient examined, no change in status, stable for surgery.  I have reviewed the patient's chart and labs.  Questions were answered to the patient's satisfaction.     Eldred Manges

## 2021-01-04 NOTE — Anesthesia Preprocedure Evaluation (Addendum)
Anesthesia Evaluation  Patient identified by MRN, date of birth, ID band Patient awake and Patient confused    Reviewed: Allergy & Precautions, NPO status , Patient's Chart, lab work & pertinent test results  Airway Mallampati: III  TM Distance: >3 FB Neck ROM: Full    Dental no notable dental hx.    Pulmonary former smoker,    Pulmonary exam normal breath sounds clear to auscultation       Cardiovascular Exercise Tolerance: Good Normal cardiovascular exam Rhythm:Regular Rate:Normal     Neuro/Psych negative neurological ROS  negative psych ROS   GI/Hepatic negative GI ROS, Neg liver ROS,   Endo/Other  hyperlipidemia  Renal/GU negative Renal ROS  negative genitourinary   Musculoskeletal  (+) Arthritis ,   Abdominal   Peds negative pediatric ROS (+)  Hematology  (+) anemia ,   Anesthesia Other Findings   Reproductive/Obstetrics                            Anesthesia Physical Anesthesia Plan  ASA: 2  Anesthesia Plan: General   Post-op Pain Management:    Induction: Intravenous  PONV Risk Score and Plan: 3 and Dexamethasone, Ondansetron, Treatment may vary due to age or medical condition and TIVA  Airway Management Planned: Oral ETT and Video Laryngoscope Planned  Additional Equipment: None  Intra-op Plan:   Post-operative Plan: Extubation in OR  Informed Consent: I have reviewed the patients History and Physical, chart, labs and discussed the procedure including the risks, benefits and alternatives for the proposed anesthesia with the patient or authorized representative who has indicated his/her understanding and acceptance.     Dental advisory given  Plan Discussed with: CRNA, Anesthesiologist and Surgeon  Anesthesia Plan Comments:        Anesthesia Quick Evaluation

## 2021-01-04 NOTE — Discharge Instructions (Addendum)
Ok to shower 1 days postop.  Cervical collar must be on at all times except when changed to other collar for shower.   Do not apply any creams or ointments to incision.  Do not remove steri-strips.  Can leave the dressing on until you see Dr. Ophelia Charter   No aggressive activity.    Do not bend or turn neck.   No lifting, pushing, pulling   No driving until further notice

## 2021-01-04 NOTE — H&P (Signed)
Requested by: Gwenlyn Fudge, FNP [address] [place],  [place] [address] PCP: Gwenlyn Fudge, FNP     Assessment & Plan: Visit Diagnoses:  1. Other spondylosis with myelopathy, cervical region       Plan: Patient has cord compression severe at C6-7 with moderate to severe stenosis at see 5-6.  Plan would be C5-6, C6-7 two-level anterior cervical discectomy and fusion with allograft and plate.  She understands that she may get some partial return and improvement in her balance.  Surgery scheduled for tomorrow.  She does have lumbar disease with some areas of stenosis but treatment of this will be deferred and she needs to urgently get the cord decompressed and stabilized with removal of a large central disc herniation causing severe cord compression at C6-7.  Risks of dural tear, paralysis, pseudoarthrosis, reoperation, dysphagia, dysphonia discussed in detail she understands request to proceed.   Follow-Up Instructions: No follow-ups on file.    Orders:  No orders of the defined types were placed in this encounter.   No orders of the defined types were placed in this encounter.        Procedures: No procedures performed     Clinical Data: No additional findings.     Subjective:     Chief Complaint  Patient presents with   Neck - Pain, Follow-up      MRI cervical spine review      HPI 55 year old female returns for cervical stenosis with myelopathy.  She has had wide-based gait legs giving way at times she has had episodes of incontinence of urine.  Numbness tingling in her hands but no weakness in her arms.  She is also noticed she has been somewhat numb from the waist down but can still walk although she does have noted weakness in her legs.  Cervical and lumbar MRI scan has been obtained and is available for review.  Lumbar MRI shows multilevel lumbar degenerative changes with some moderate to severe  stenosis at L3-4 and moderate at L4-5 and L5-S1.  Cervical MRI shows severe cord compression with large disc extrusion and edema of the cord with abnormal T2 hyperintensity.  She has 75% of her canal at see 6-7 occupied by the large central disc herniation with severe compression.  She also has spinal stenosis with the right cord flattening at C5-6.  Less severe changes at C3 -4 and C4 -5.   Review of Systems     Objective: Vital Signs: Ht 5\' 6"  (1.676 m)   Wt 206 lb (93.4 kg)   BMI 33.25 kg/m    Physical Exam Constitutional:      Appearance: She is well-developed.  HENT:     Head: Normocephalic.     Right Ear: External ear normal.     Left Ear: External ear normal. There is no impacted cerumen.  Eyes:     Pupils: Pupils are equal, round, and reactive to light.  Neck:     Thyroid: No thyromegaly.     Trachea: No tracheal deviation.  Cardiovascular:     Rate and Rhythm: Normal rate.  Pulmonary:     Effort: Pulmonary effort is normal.  Abdominal:     Palpations: Abdomen is soft.  Musculoskeletal:     Cervical back: No rigidity.  Skin:    General: Skin is warm and dry.  Neurological:     Mental Status: She is alert and oriented to person, place, and time.  Psychiatric:  Behavior: Behavior normal.      Ortho Exam patient has intact upper extremity reflexes.  Slight wide-based gait decreased balance.  3-4+ lower extremity reflexes without clonus.  Biceps triceps taken resistance no pinching in the shoulder.  She has positive Lhermitte, positive Spurling right and left severe brachial plexus tenderness increased pain with compression no improvement with distraction.   Specialty Comments:  No specialty comments available.   Imaging: CLINICAL DATA:  Neck pain with bilateral shoulder and arm pain for 2 months   EXAM: MRI CERVICAL SPINE WITHOUT CONTRAST   TECHNIQUE: Multiplanar, multisequence MR imaging of the cervical spine was performed. No intravenous contrast  was administered.   COMPARISON:  None.   FINDINGS: Alignment: Degenerative reversal of cervical lordosis with mild C2-3 anterolisthesis.   Vertebrae: No fracture, evidence of discitis, or bone lesion.   Cord: T2 hyperintensity in the compressed cord at the level of C6-7.   Posterior Fossa, vertebral arteries, paraspinal tissues: No evidence of perispinal mass or inflammation. 7 mm right thyroid nodule. No followup recommended (ref: J Am Coll Radiol. 2015 Feb;12(2): 143-50).   Disc levels:   C2-3: Unremarkable.   C3-4: Disc narrowing with bulging. Endplate and uncovertebral ridging with biforaminal impingement. Spinal stenosis with ventral cord indentation.   C4-5: Disc narrowing and bulging with uncovertebral ridging bilaterally. Spinal stenosis with ventral cord flattening. Biforaminal impingement   C5-6: Disc narrowing and bulging with bulky right eccentric ridging. Spinal stenosis with right cord flattening. Right more than left foraminal impingement   C6-7: Large disc herniation with cord compression and T2 hyperintensity. Right foraminal protrusion and impingement.   C7-T1:Unremarkable.   These results will be called to the ordering clinician or representative by the Radiologist Assistant, and communication documented in the PACS or [employer].   IMPRESSION: 1. C6-7 disc extrusion with cord compression and edema. 2. Chronic degenerative spinal stenosis at C3-4 to C5-6 with ventral cord flattening. 3. Foraminal impingement at C3-4 to C6-7, as above.     Electronically Signed   By: Marnee Spring M.D.   On: 01/01/2021 07:32   CLINICAL DATA:  open MRI lumbar spine eval for lumbar stenosis-falling with bilateral lower extremity numbness   EXAM: MRI LUMBAR SPINE WITHOUT CONTRAST   TECHNIQUE: Multiplanar, multisequence MR imaging of the lumbar spine was performed. No intravenous contrast was administered.   COMPARISON:  None.    FINDINGS: Segmentation: Presumed standard anatomy with the inferior-most well developed disc space designated as L5-S1.   Alignment:  Physiologic.   Vertebrae: No fracture, evidence of discitis, or bone lesion. Discogenic endplate marrow changes at L4-5. Mild diffuse intrinsic canal narrowing on the basis of congenitally short pedicles.   Conus medullaris and cauda equina: Conus extends to the L1-2 level. Conus and cauda equina appear normal.   Paraspinal and other soft tissues: Negative.   Disc levels:   T12-L1: No disc protrusion. Mild bilateral facet arthropathy. No foraminal or canal stenosis.   L1-L2: No disc protrusion. Mild bilateral facet arthropathy. No foraminal or canal stenosis.   L2-L3: Circumferential disc bulge with small central disc protrusion. Bilateral facet arthropathy and ligamentum flavum buckling. Findings contribute to mild canal stenosis. No significant foraminal stenosis.   L3-L4: Circumferential disc bulge with bilateral facet arthropathy and ligamentum flavum buckling. Findings result in moderate to severe canal stenosis with mild bilateral foraminal stenosis.   L4-L5: Circumferential disc osteophyte complex with bilateral facet arthropathy and ligamentum flavum buckling. Findings result in moderate canal stenosis with moderate bilateral foraminal stenosis.  L5-S1: Disc osteophyte complex, eccentric to the right with advanced bilateral facet arthropathy. Moderate canal stenosis with right worse than left subarticular recess stenosis and moderate to severe right foraminal stenosis. Mild left foraminal stenosis.   IMPRESSION: 1. Multilevel degenerative changes of the lumbar spine superimposed on a congenitally narrowed canal. Findings are most pronounced at the L3-4 level where there is moderate-to-severe canal stenosis and mild bilateral foraminal stenosis. 2. Moderate canal stenosis at L4-5 and L5-S1 with moderate bilateral foraminal  stenosis at L4-5 and moderate-to-severe right foraminal stenosis at L5-S1.     Electronically Signed   By: Duanne Guess D.O.   On: 01/01/2021 08:05 PMFS History:     Patient Active Problem List    Diagnosis Date Noted   Other spondylosis with myelopathy, cervical region 12/28/2020   Other intervertebral disc degeneration, lumbar region 12/28/2020   Mixed hyperlipidemia          Past Medical History:  Diagnosis Date   Medical history non-contributory     Mixed hyperlipidemia 06/10/2019         Family History  Problem Relation Age of Onset   Hypertension Mother     Cancer Father          bone marrow   Thyroid disease Sister     Thyroid disease Brother     Hypertension Maternal Grandmother     Emphysema Paternal Grandmother     Diabetes Paternal Grandfather     Thyroid disease Brother      No past surgical history on file. Social History         Occupational History   Not on file  Tobacco Use   Smoking status: Former      Types: Cigarettes      Quit date: 06/09/2018      Years since quitting: 2.5   Smokeless tobacco: Never  Vaping Use   Vaping Use: Never used  Substance and Sexual Activity   Alcohol use: Yes      Comment: occ   Drug use: Never   Sexual activity: Not on file

## 2021-01-04 NOTE — Evaluation (Signed)
Occupational Therapy Evaluation and Discharge Patient Details Name: Jamie Jacobson MRN: [id number] DOB: [birth year] Today's Date: 01/04/2021    History of Present Illness Pt is a 55 yo female s/p C5-6, C6-7 anterior cervical discectomy and fusion   Clinical Impression   This 55 yo female admitted and underwent above presents to acute OT with all education completed, we will D/C from acute OT.    Follow Up Recommendations  No OT follow up;Supervision - Intermittent    Equipment Recommendations  None recommended by OT       Precautions / Restrictions Precautions Precautions: Cervical Precaution Booklet Issued: Yes (comment) Required Braces or Orthoses: Cervical Brace Cervical Brace: At all times Restrictions Weight Bearing Restrictions: No      Mobility Bed Mobility Overal bed mobility: Modified Independent                  Transfers Overall transfer level: Independent                    Balance Overall balance assessment: Independent                                         ADL either performed or assessed with clinical judgement   ADL Overall ADL's : Modified independent                                       General ADL Comments: Educated on all the parameters for self care while healing from cervical sugery and handout provided     Vision Patient Visual Report: No change from baseline              Pertinent Vitals/Pain Pain Assessment: Faces Faces Pain Scale: Hurts a little bit Pain Location: neck Pain Descriptors / Indicators: Sore Pain Intervention(s): Limited activity within patient's tolerance;Monitored during session     Hand Dominance Right   Extremity/Trunk Assessment Upper Extremity Assessment Upper Extremity Assessment: Overall WFL for tasks assessed           Communication Communication Communication: No difficulties   Cognition Arousal/Alertness: Awake/alert Behavior During Therapy:  WFL for tasks assessed/performed Overall Cognitive Status: Within Functional Limits for tasks assessed                                                Home Living Family/patient expects to be discharged to:: Private residence Living Arrangements: Children;Other relatives Available Help at Discharge: Family;Available PRN/intermittently Type of Home: House             Bathroom Shower/Tub: Tub/shower unit         Home Equipment: None          Prior Functioning/Environment Level of Independence: Independent                 OT Problem List: Pain         OT Goals(Current goals can be found in the care plan section) Acute Rehab OT Goals Patient Stated Goal: to go home tomorrow OT Goal Formulation: All assessment and education complete, DC therapy                AM-PAC OT "6 Clicks" Daily  Activity     Outcome Measure Help from another person eating meals?: None Help from another person taking care of personal grooming?: None Help from another person toileting, which includes using toliet, bedpan, or urinal?: None Help from another person bathing (including washing, rinsing, drying)?: None Help from another person to put on and taking off regular upper body clothing?: None Help from another person to put on and taking off regular lower body clothing?: None 6 Click Score: 24   End of Session    Activity Tolerance: Patient tolerated treatment well Patient left: in chair;with call bell/phone within reach  OT Visit Diagnosis: Pain Pain - part of body:  (neck and shoulders)                Time: 2951-8841 OT Time Calculation (min): 24 min Charges:  OT General Charges $OT Visit: 1 Visit OT Evaluation $OT Eval Moderate Complexity: 1 Mod OT Treatments $Self Care/Home Management : 8-22 mins Jamie Jacobson, OTR/L Acute [employer] Pager [phone] Office [phone]    Jamie Jacobson 01/04/2021, 5:50 PM

## 2021-01-04 NOTE — Anesthesia Procedure Notes (Signed)
Procedure Name: Intubation Date/Time: 01/04/2021 10:50 AM Performed by: Jonna Munro, CRNA Pre-anesthesia Checklist: Patient identified, Emergency Drugs available, Suction available, Patient being monitored and Timeout performed Patient Re-evaluated:Patient Re-evaluated prior to induction Oxygen Delivery Method: Circle system utilized Preoxygenation: Pre-oxygenation with 100% oxygen Induction Type: IV induction Ventilation: Mask ventilation without difficulty Laryngoscope Size: Mac, 3 and Glidescope Grade View: Grade II Tube type: Oral Number of attempts: 1 Airway Equipment and Method: Video-laryngoscopy and Rigid stylet Placement Confirmation: positive ETCO2, ETT inserted through vocal cords under direct vision and breath sounds checked- equal and bilateral Secured at: 21 cm Tube secured with: Tape Dental Injury: Teeth and Oropharynx as per pre-operative assessment  Difficulty Due To: Difficult Airway-  due to neck instability and Difficult Airway- due to limited oral opening

## 2021-01-04 NOTE — Anesthesia Postprocedure Evaluation (Signed)
Anesthesia Post Note  Patient: Jamie Jacobson  Procedure(s) Performed: C5-6, C6-7 ANTERIOR CERVICAL DISCECTOMY FUSION, ALLOGRAFT, PLATE     Patient location during evaluation: PACU Anesthesia Type: General Level of consciousness: sedated Pain management: pain level controlled Vital Signs Assessment: post-procedure vital signs reviewed and stable Respiratory status: spontaneous breathing and respiratory function stable Cardiovascular status: stable Postop Assessment: no apparent nausea or vomiting Anesthetic complications: no   No notable events documented.  Last Vitals:  Vitals:   01/04/21 1445 01/04/21 1514  BP: 135/78 139/80  Pulse: 83 77  Resp: 11 20  Temp: 36.7 C   SpO2: 95% 98%    Last Pain:  Vitals:   01/04/21 1330  TempSrc:   PainSc: 0-No pain                 Mellody Dance

## 2021-01-05 DIAGNOSIS — Z87891 Personal history of nicotine dependence: Secondary | ICD-10-CM | POA: Diagnosis not present

## 2021-01-05 DIAGNOSIS — M4802 Spinal stenosis, cervical region: Secondary | ICD-10-CM | POA: Diagnosis not present

## 2021-01-05 DIAGNOSIS — Z20822 Contact with and (suspected) exposure to covid-19: Secondary | ICD-10-CM | POA: Diagnosis not present

## 2021-01-05 DIAGNOSIS — M9971 Connective tissue and disc stenosis of intervertebral foramina of cervical region: Secondary | ICD-10-CM | POA: Diagnosis not present

## 2021-01-05 DIAGNOSIS — M4712 Other spondylosis with myelopathy, cervical region: Secondary | ICD-10-CM | POA: Diagnosis not present

## 2021-01-05 NOTE — Progress Notes (Signed)
Patient was transported via wheelchair by NT for discharge home; in no acute distress nor complaints of pain nor discomfort; room was checked and accounted for all her belongings; discharge instructions given to patient by RN and she verbalized understanding on the instructions given. 

## 2021-01-05 NOTE — Progress Notes (Signed)
   Subjective: 1 Day Post-Op Procedure(s) (LRB): C5-6, C6-7 ANTERIOR CERVICAL DISCECTOMY FUSION, ALLOGRAFT, PLATE (N/A) Patient reports pain as mild.  "My legs stopped tingling and I can walk normal, I'm not staggering anymore."  Objective: Vital signs in last 24 hours: Temp:  [97.7 F (36.5 C)-98.1 F (36.7 C)] 97.7 F (36.5 C) (08/20 0400) Pulse Rate:  [76-95] 83 (08/20 0400) Resp:  [8-20] 18 (08/20 0400) BP: (104-151)/(71-91) 144/81 (08/20 0400) SpO2:  [94 %-99 %] 99 % (08/20 0400) Weight:  [88.3 kg] 88.3 kg (08/19 0720)  Intake/Output from previous day: 08/19 0701 - 08/20 0700 In: 1450 [I.V.:1350; IV Piggyback:100] Out: 540 [Urine:450; Drains:40; Blood:50] Intake/Output this shift: No intake/output data recorded.  Recent Labs    01/04/21 0730  HGB 11.9*   Recent Labs    01/04/21 0730  WBC 7.5  RBC 4.16  HCT 37.8  PLT 327   Recent Labs    01/04/21 0730  NA 137  K 4.0  CL 103  CO2 24  BUN 17  CREATININE 0.71  GLUCOSE 98  CALCIUM 8.9   No results for input(s): LABPT, INR in the last 72 hours.  Neurologically intact DG Cervical Spine 2 or 3 views  Result Date: 01/04/2021 CLINICAL DATA:  Surgery, elective Z41.9 (ICD-10-CM). Additional history provided by technologist: C5-6, C6-7 anterior cervical discectomy fusion, allograft, plate. Provided fluoroscopy time 24 seconds (4.19 mGy). EXAM: CERVICAL SPINE - 2-3 VIEW; DG C-ARM 1-60 MIN COMPARISON:  MRI of the cervical spine 01/01/2021. FINDINGS: AP and lateral view intraoperative fluoroscopic images of the cervical spine are submitted, 4 images total. On the most recent provided images, ACDF hardware spans the C5-C7 levels (ventral plate and screws, as well as interbody devices). Partially visualized ET tube. IMPRESSION: Three intraoperative fluoroscopic images of the cervical spine from C5-C7 ACDF. Electronically Signed   By: Jackey Loge D.O.   On: 01/04/2021 13:01   DG C-Arm 1-60 Min  Result Date:  01/04/2021 CLINICAL DATA:  Surgery, elective Z41.9 (ICD-10-CM). Additional history provided by technologist: C5-6, C6-7 anterior cervical discectomy fusion, allograft, plate. Provided fluoroscopy time 24 seconds (4.19 mGy). EXAM: CERVICAL SPINE - 2-3 VIEW; DG C-ARM 1-60 MIN COMPARISON:  MRI of the cervical spine 01/01/2021. FINDINGS: AP and lateral view intraoperative fluoroscopic images of the cervical spine are submitted, 4 images total. On the most recent provided images, ACDF hardware spans the C5-C7 levels (ventral plate and screws, as well as interbody devices). Partially visualized ET tube. IMPRESSION: Three intraoperative fluoroscopic images of the cervical spine from C5-C7 ACDF. Electronically Signed   By: Jackey Loge D.O.   On: 01/04/2021 13:01    Assessment/Plan: 1 Day Post-Op Procedure(s) (LRB): C5-6, C6-7 ANTERIOR CERVICAL DISCECTOMY FUSION, ALLOGRAFT, PLATE (N/A) Plan;  discharge home. Drain removed.   Jamie Jacobson 01/05/2021, 7:04 AM

## 2021-01-07 ENCOUNTER — Encounter (HOSPITAL_COMMUNITY): Payer: Self-pay | Admitting: Orthopaedic Surgery

## 2021-01-08 NOTE — Discharge Summary (Signed)
Patient ID: Jamie Jacobson MRN: [id number] DOB/AGE: December 20, 1965 55 y.o.  Admit date: 01/04/2021 Discharge date: 01/05/2021  Admission Diagnoses:  Active Problems:   Other spondylosis with myelopathy, cervical region   Cervical stenosis of spine   Discharge Diagnoses:  Active Problems:   Other spondylosis with myelopathy, cervical region   Cervical stenosis of spine  status post Procedure(s): C5-6, C6-7 ANTERIOR CERVICAL DISCECTOMY FUSION, ALLOGRAFT, PLATE  Past Medical History:  Diagnosis Date   Medical history non-contributory    Mixed hyperlipidemia 06/10/2019    Surgeries: Procedure(s): C5-6, C6-7 ANTERIOR CERVICAL DISCECTOMY FUSION, ALLOGRAFT, PLATE on 08/25/8117   Consultants:   Discharged Condition: Improved  Hospital Course: Jamie Jacobson is an 55 y.o. female who was admitted 01/04/2021 for operative treatment of C5-6 and C6-7 HNP/stenosis. Patient failed conservative treatments (please see the history and physical for the specifics) and had severe unremitting pain that affects sleep, daily activities and work/hobbies. After pre-op clearance, the patient was taken to the operating room on 01/04/2021 and underwent  Procedure(s): C5-6, C6-7 ANTERIOR CERVICAL DISCECTOMY FUSION, ALLOGRAFT, PLATE.    Patient was given perioperative antibiotics:  Anti-infectives (From admission, onward)    Start     Dose/Rate Route Frequency Ordered Stop   01/04/21 0715  ceFAZolin (ANCEF) IVPB 2g/100 mL premix        2 g 200 mL/hr over 30 Minutes Intravenous On call to O.R. 01/04/21 0701 01/04/21 1032        Patient was given sequential compression devices and early ambulation to prevent DVT.   Patient benefited maximally from hospital stay and there were no complications. At the time of discharge, the patient was urinating/moving their bowels without difficulty, tolerating a regular diet, pain is controlled with oral pain medications and they have been cleared by PT/OT.   Recent  vital signs: No data found.   Recent laboratory studies: No results for input(s): WBC, HGB, HCT, PLT, NA, K, CL, CO2, BUN, CREATININE, GLUCOSE, INR, CALCIUM in the last 72 hours.  Invalid input(s): PT, 2   Discharge Medications:   Allergies as of 01/05/2021   No Known Allergies      Medication List     STOP taking these medications    diazepam 5 MG tablet Commonly known as: VALIUM   Salonpas 3.05-24-08 % Ptch Generic drug: Camphor-Menthol-Methyl Sal       TAKE these medications    methocarbamol 500 MG tablet Commonly known as: Robaxin Take 1 tablet (500 mg total) by mouth every 6 (six) hours as needed for muscle spasms.   oxyCODONE-acetaminophen 5-325 MG tablet Commonly known as: PERCOCET/ROXICET Take 1 tablet by mouth every 6 (six) hours as needed for severe pain.        Diagnostic Studies: DG Cervical Spine 2 or 3 views  Result Date: 01/04/2021 CLINICAL DATA:  Surgery, elective Z41.9 (ICD-10-CM). Additional history provided by technologist: C5-6, C6-7 anterior cervical discectomy fusion, allograft, plate. Provided fluoroscopy time 24 seconds (4.19 mGy). EXAM: CERVICAL SPINE - 2-3 VIEW; DG C-ARM 1-60 MIN COMPARISON:  MRI of the cervical spine 01/01/2021. FINDINGS: AP and lateral view intraoperative fluoroscopic images of the cervical spine are submitted, 4 images total. On the most recent provided images, ACDF hardware spans the C5-C7 levels (ventral plate and screws, as well as interbody devices). Partially visualized ET tube. IMPRESSION: Three intraoperative fluoroscopic images of the cervical spine from C5-C7 ACDF. Electronically Signed   By: Jackey Loge D.O.   On: 01/04/2021 13:01   MR Cervical Spine w/o contrast  Result Date: 01/01/2021 CLINICAL DATA:  Neck pain with bilateral shoulder and arm pain for 2 months EXAM: MRI CERVICAL SPINE WITHOUT CONTRAST TECHNIQUE: Multiplanar, multisequence MR imaging of the cervical spine was performed. No intravenous contrast was  administered. COMPARISON:  None. FINDINGS: Alignment: Degenerative reversal of cervical lordosis with mild C2-3 anterolisthesis. Vertebrae: No fracture, evidence of discitis, or bone lesion. Cord: T2 hyperintensity in the compressed cord at the level of C6-7. Posterior Fossa, vertebral arteries, paraspinal tissues: No evidence of perispinal mass or inflammation. 7 mm right thyroid nodule. No followup recommended (ref: J Am Coll Radiol. 2015 Feb;12(2): 143-50). Disc levels: C2-3: Unremarkable. C3-4: Disc narrowing with bulging. Endplate and uncovertebral ridging with biforaminal impingement. Spinal stenosis with ventral cord indentation. C4-5: Disc narrowing and bulging with uncovertebral ridging bilaterally. Spinal stenosis with ventral cord flattening. Biforaminal impingement C5-6: Disc narrowing and bulging with bulky right eccentric ridging. Spinal stenosis with right cord flattening. Right more than left foraminal impingement C6-7: Large disc herniation with cord compression and T2 hyperintensity. Right foraminal protrusion and impingement. C7-T1:Unremarkable. These results will be called to the ordering clinician or representative by the Radiologist Assistant, and communication documented in the PACS or [employer]. IMPRESSION: 1. C6-7 disc extrusion with cord compression and edema. 2. Chronic degenerative spinal stenosis at C3-4 to C5-6 with ventral cord flattening. 3. Foraminal impingement at C3-4 to C6-7, as above. Electronically Signed   By: Marnee Spring M.D.   On: 01/01/2021 07:32   MR Lumbar Spine w/o contrast  Result Date: 01/01/2021 CLINICAL DATA:  open MRI lumbar spine eval for lumbar stenosis-falling with bilateral lower extremity numbness EXAM: MRI LUMBAR SPINE WITHOUT CONTRAST TECHNIQUE: Multiplanar, multisequence MR imaging of the lumbar spine was performed. No intravenous contrast was administered. COMPARISON:  None. FINDINGS: Segmentation: Presumed standard anatomy with the  inferior-most well developed disc space designated as L5-S1. Alignment:  Physiologic. Vertebrae: No fracture, evidence of discitis, or bone lesion. Discogenic endplate marrow changes at L4-5. Mild diffuse intrinsic canal narrowing on the basis of congenitally short pedicles. Conus medullaris and cauda equina: Conus extends to the L1-2 level. Conus and cauda equina appear normal. Paraspinal and other soft tissues: Negative. Disc levels: T12-L1: No disc protrusion. Mild bilateral facet arthropathy. No foraminal or canal stenosis. L1-L2: No disc protrusion. Mild bilateral facet arthropathy. No foraminal or canal stenosis. L2-L3: Circumferential disc bulge with small central disc protrusion. Bilateral facet arthropathy and ligamentum flavum buckling. Findings contribute to mild canal stenosis. No significant foraminal stenosis. L3-L4: Circumferential disc bulge with bilateral facet arthropathy and ligamentum flavum buckling. Findings result in moderate to severe canal stenosis with mild bilateral foraminal stenosis. L4-L5: Circumferential disc osteophyte complex with bilateral facet arthropathy and ligamentum flavum buckling. Findings result in moderate canal stenosis with moderate bilateral foraminal stenosis. L5-S1: Disc osteophyte complex, eccentric to the right with advanced bilateral facet arthropathy. Moderate canal stenosis with right worse than left subarticular recess stenosis and moderate to severe right foraminal stenosis. Mild left foraminal stenosis. IMPRESSION: 1. Multilevel degenerative changes of the lumbar spine superimposed on a congenitally narrowed canal. Findings are most pronounced at the L3-4 level where there is moderate-to-severe canal stenosis and mild bilateral foraminal stenosis. 2. Moderate canal stenosis at L4-5 and L5-S1 with moderate bilateral foraminal stenosis at L4-5 and moderate-to-severe right foraminal stenosis at L5-S1. Electronically Signed   By: Duanne Guess D.O.   On:  01/01/2021 08:05   DG C-Arm 1-60 Min  Result Date: 01/04/2021 CLINICAL DATA:  Surgery, elective Z41.9 (ICD-10-CM). Additional history  provided by technologist: C5-6, C6-7 anterior cervical discectomy fusion, allograft, plate. Provided fluoroscopy time 24 seconds (4.19 mGy). EXAM: CERVICAL SPINE - 2-3 VIEW; DG C-ARM 1-60 MIN COMPARISON:  MRI of the cervical spine 01/01/2021. FINDINGS: AP and lateral view intraoperative fluoroscopic images of the cervical spine are submitted, 4 images total. On the most recent provided images, ACDF hardware spans the C5-C7 levels (ventral plate and screws, as well as interbody devices). Partially visualized ET tube. IMPRESSION: Three intraoperative fluoroscopic images of the cervical spine from C5-C7 ACDF. Electronically Signed   By: Jackey Loge D.O.   On: 01/04/2021 13:01   XR Cervical Spine 2 or 3 views  Result Date: 12/28/2020 AP lateral cervical spine x-ray showed multilevel spondylitic changes with narrowing at C3-4 C4-5 C5-6.  Spurring more prominent at C3-4 and C5-6.  Uncovertebral changes noted on AP x-ray. Impression: Multilevel cervical spondylosis.  Straightening of cervical spine.  More spurring and narrowing at C3-4 and C5-6.  XR Lumbar Spine 2-3 Views  Result Date: 12/28/2020 AP lateral lumbar spine x-rays are obtained and reviewed.  This shows disc space narrowing at L4-5, anterior spurring at L3-4.  Normal lumbar curvature.  Normal SI joints. Impression: Changes consistent with lumbar disc degeneration without acute fracture.   Discharge Instructions     Incentive spirometry RT   Complete by: As directed         Follow-up Information     Eldred Manges, MD. Schedule an appointment as soon as possible for a visit today.   Specialty: Orthopedic Surgery Why: need return office visit with Dr. Ophelia Charter 1 week postop.  Call to schedule appointment.you can see Dr. Ophelia Charter in [place] on next Thursday Contact information: [address] [place]  [address] [phone]                 Discharge Plan:  discharge to home  Disposition:     Signed: Zonia Kief  01/08/2021, 4:15 PM

## 2021-01-10 ENCOUNTER — Other Ambulatory Visit: Payer: Self-pay

## 2021-01-10 ENCOUNTER — Ambulatory Visit (INDEPENDENT_AMBULATORY_CARE_PROVIDER_SITE_OTHER): Payer: BC Managed Care – PPO | Admitting: Orthopaedic Surgery

## 2021-01-10 ENCOUNTER — Encounter: Payer: Self-pay | Admitting: Orthopaedic Surgery

## 2021-01-10 VITALS — Ht 67.0 in | Wt 194.0 lb

## 2021-01-10 DIAGNOSIS — Z981 Arthrodesis status: Secondary | ICD-10-CM

## 2021-01-10 MED ORDER — HYDROCODONE-ACETAMINOPHEN 5-325 MG PO TABS: 1.0000 | ORAL_TABLET | Freq: Four times a day (QID) | ORAL | 0 refills | Status: AC | PRN

## 2021-01-10 NOTE — Progress Notes (Signed)
   Post-Op Visit Note   Patient: Jamie Jacobson           Date of Birth: [birth year]           MRN: [id number] Visit Date: 01/10/2021 PCP: Gwenlyn Fudge, FNP   Assessment & Plan: Post two-level cervical fusion with preop myelopathy.  She states she is already been out walking in the neighborhood and her gait is improved.  Numbness and tingling in her fingers is improved.  I will check her back again in 5 weeks with lateral flexion-extension x-ray.  She is happy with the results of surgery Steri-Strips changed incision looks good.  Chief Complaint:  Chief Complaint  Patient presents with   Neck - Routine Post Op    01/04/2021 C5-6, C6-7 ACDF   Visit Diagnoses:  1. Status post cervical spinal fusion     Plan: Return 5 weeks lateral flexion-extension 2 view x-ray cervical spine on return.  Follow-Up Instructions: No follow-ups on file.   Orders:  Orders Placed This Encounter  Procedures   XR Cervical Spine 2 or 3 views   No orders of the defined types were placed in this encounter.   Imaging: No results found.  PMFS History: Patient Active Problem List   Diagnosis Date Noted   Cervical stenosis of spine 01/04/2021   Other spondylosis with myelopathy, cervical region 12/28/2020   Other intervertebral disc degeneration, lumbar region 12/28/2020   Mixed hyperlipidemia    Past Medical History:  Diagnosis Date   Medical history non-contributory    Mixed hyperlipidemia 06/10/2019    Family History  Problem Relation Age of Onset   Hypertension Mother    Cancer Father        bone marrow   Thyroid disease Sister    Thyroid disease Brother    Hypertension Maternal Grandmother    Emphysema Paternal Grandmother    Diabetes Paternal Grandfather    Thyroid disease Brother     Past Surgical History:  Procedure Laterality Date   ANTERIOR CERVICAL DECOMP/DISCECTOMY FUSION N/A 01/04/2021   Procedure: C5-6, C6-7 ANTERIOR CERVICAL DISCECTOMY FUSION, ALLOGRAFT, PLATE;  Surgeon:  Eldred Manges, MD;  Location: [facility] OR;  Service: Orthopedics;  Laterality: N/A;   NO PAST SURGERIES     Social History   Occupational History   Not on file  Tobacco Use   Smoking status: Former    Types: Cigarettes    Quit date: 06/09/2018    Years since quitting: 2.5   Smokeless tobacco: Never  Vaping Use   Vaping Use: Never used  Substance and Sexual Activity   Alcohol use: Yes    Comment: occ   Drug use: Never   Sexual activity: Not on file

## 2021-02-14 ENCOUNTER — Encounter: Payer: Self-pay | Admitting: Orthopaedic Surgery

## 2021-02-14 ENCOUNTER — Other Ambulatory Visit: Payer: Self-pay

## 2021-02-14 ENCOUNTER — Ambulatory Visit (INDEPENDENT_AMBULATORY_CARE_PROVIDER_SITE_OTHER): Payer: BC Managed Care – PPO | Admitting: Orthopaedic Surgery

## 2021-02-14 VITALS — Ht 67.0 in | Wt 194.0 lb

## 2021-02-14 DIAGNOSIS — Z981 Arthrodesis status: Secondary | ICD-10-CM

## 2021-02-14 NOTE — Progress Notes (Signed)
   Post-Op Visit Note   Patient: Jamie Jacobson           Date of Birth: December 11, 1965           MRN: [id number] Visit Date: 02/14/2021 PCP: Gwenlyn Fudge, FNP   Assessment & Plan: Postop two-level cervical fusion she took her collar off last week x-ray showed good incorporation of the grafts without motion.  Incision looks good good relief of preop pain she sometimes has some headaches.  Chief Complaint:  Chief Complaint  Patient presents with   Neck - Follow-up    01/04/2021 C5-6, C6-7 ACDF   Visit Diagnoses:  1. Status post cervical spinal fusion     Plan: Post cervical fusion patient is happy the surgical result office follow-up as needed.  Follow-Up Instructions: No follow-ups on file.   Orders:  Orders Placed This Encounter  Procedures   XR Cervical Spine 2 or 3 views   No orders of the defined types were placed in this encounter.   Imaging: XR Cervical Spine 2 or 3 views  Result Date: 02/14/2021 AP and lateral flexion-extension C-spine x-rays obtained and reviewed.  No motion good incorporation of the graft. Impression satisfactory C5-6, C6-7 two-level anterior cervical discectomy and fusion with allograft and plate.  No motion on flexion-extension views.   PMFS History: Patient Active Problem List   Diagnosis Date Noted   Cervical stenosis of spine 01/04/2021   Other spondylosis with myelopathy, cervical region 12/28/2020   Other intervertebral disc degeneration, lumbar region 12/28/2020   Mixed hyperlipidemia    Past Medical History:  Diagnosis Date   Medical history non-contributory    Mixed hyperlipidemia 06/10/2019    Family History  Problem Relation Age of Onset   Hypertension Mother    Cancer Father        bone marrow   Thyroid disease Sister    Thyroid disease Brother    Hypertension Maternal Grandmother    Emphysema Paternal Grandmother    Diabetes Paternal Grandfather    Thyroid disease Brother     Past Surgical History:  Procedure  Laterality Date   ANTERIOR CERVICAL DECOMP/DISCECTOMY FUSION N/A 01/04/2021   Procedure: C5-6, C6-7 ANTERIOR CERVICAL DISCECTOMY FUSION, ALLOGRAFT, PLATE;  Surgeon: Eldred Manges, MD;  Location: [facility] OR;  Service: Orthopedics;  Laterality: N/A;   NO PAST SURGERIES     Social History   Occupational History   Not on file  Tobacco Use   Smoking status: Former    Types: Cigarettes    Quit date: 06/09/2018    Years since quitting: 2.6   Smokeless tobacco: Never  Vaping Use   Vaping Use: Never used  Substance and Sexual Activity   Alcohol use: Yes    Comment: occ   Drug use: Never   Sexual activity: Not on file
